# Patient Record
Sex: Female | Born: 1995 | Hispanic: No | Marital: Single | Smoking: Never smoker
Health system: Southern US, Community
[De-identification: ages and names within clinical notes are randomized; demographics above are authoritative.]

## PROBLEM LIST (undated history)

## (undated) ENCOUNTER — Ambulatory Visit: Discharge status: Absent without leave | Payer: Federal, State, Local not specified - PPO

## (undated) ENCOUNTER — Emergency Department (HOSPITAL_COMMUNITY): Discharge status: Against Medical Advice | Payer: Federal, State, Local not specified - PPO

## (undated) DIAGNOSIS — D509 Iron deficiency anemia, unspecified: Secondary | ICD-10-CM

## (undated) DIAGNOSIS — F339 Major depressive disorder, recurrent, unspecified: Secondary | ICD-10-CM

## (undated) HISTORY — DX: Iron deficiency anemia, unspecified: D50.9

## (undated) HISTORY — DX: Major depressive disorder, recurrent, unspecified: F33.9

---
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
## 2015-11-26 HISTORY — PX: SIGMOIDOSCOPY: SUR1295

## 2016-01-03 ENCOUNTER — Ambulatory Visit: Payer: Federal, State, Local not specified - PPO | Admitting: Physical Therapy

## 2016-01-03 ENCOUNTER — Encounter: Payer: Self-pay | Admitting: Physical Therapy

## 2016-01-03 ENCOUNTER — Ambulatory Visit: Payer: Federal, State, Local not specified - PPO | Attending: Obstetrics & Gynecology | Admitting: Physical Therapy

## 2016-01-03 DIAGNOSIS — R279 Unspecified lack of coordination: Secondary | ICD-10-CM | POA: Diagnosis present

## 2016-01-03 DIAGNOSIS — M62838 Other muscle spasm: Secondary | ICD-10-CM

## 2016-01-03 NOTE — Therapy (Signed)
[facility] [address] [place], [place], [address] Phone: [phone]   Fax:  [phone]  Physical Therapy Evaluation  Patient Details  Name: Amy Herring MRN: [id number] Date of Birth: Jul 24, 1995 Referring Provider: Dr. Forbes Cellar  Encounter Date: 01/03/2016      PT End of Session - 01/03/16 1427    Visit Number 1   Date for PT Re-Evaluation 05/02/16   PT Start Time 1022  stuck in a traffic jam   PT Stop Time 1100   PT Time Calculation (min) 38 min   Activity Tolerance Patient tolerated treatment well   Behavior During Therapy Anxious      History reviewed. No pertinent past medical history.  Past Surgical History:  Procedure Laterality Date  . SIGMOIDOSCOPY  11/26/2015    There were no vitals filed for this visit.       Subjective Assessment - 01/03/16 1032    Subjective Patient reports her vagina has unvoluntary spasms.  Patient unable to have intercourse due to feeling like ther is a wall and everything is very tight. Has been happening for 3 years. Vaginal area is dry. Unable to do foreplay in vaginal area due to pain. Unable to wear a tampon. Not able to have a vaginal exam.    Patient Stated Goals be able to have intercourse, use a tampon, reduce painic attacks with sex   Currently in Pain? Yes   Pain Score 10-Worst pain ever   Pain Location Vagina   Pain Orientation Mid   Pain Descriptors / Indicators Sharp   Pain Type Chronic pain   Pain Onset Other (comment)   Pain Frequency Intermittent   Aggravating Factors  intercourse; tampone, vaginal exam   Pain Relieving Factors no touching in the vaginal    Multiple Pain Sites No            [facility] PT Assessment - 01/03/16 0001      Assessment   Medical Diagnosis Vaginismus   Referring Provider Dr. Reita Cliche Louie   Onset Date/Surgical Date 02/04/12   Prior Therapy None     Precautions   Precautions None     Restrictions   Weight Bearing Restrictions No     Balance Screen   Has the patient fallen in the past 6 months No   Has the patient had a decrease in activity level because of a fear of falling?  No   Is the patient reluctant to leave their home because of a fear of falling?  No     Home Tourist information centre manager residence     Prior Function   Level of Independence Independent   Vocation Student;Part time employment   Vocation Requirements standing; goes for nursing   Leisure Volleyball team-practic 2 hours 5 days/week; cheerleading     Cognition   Overall Cognitive Status Within Functional Limits for tasks assessed     Observation/Other Assessments   Focus on Therapeutic Outcomes (FOTO)  PFDI survey is 17% limitation     Posture/Postural Control   Posture/Postural Control No significant limitations     ROM / Strength   AROM / PROM / Strength AROM;Strength;PROM     AROM   Lumbar Extension decreased by 25%   Lumbar - Left Side Bend decreased by 25%     PROM   PROM Assessment Site Hip   Right Hip External Rotation  60   Left Hip Flexion 110   Left Hip External  Rotation  60     Strength   Overall Strength Comments bil. hip strength is 5/5     Right Hip   Right Hip Flexion 105     Palpation   Palpation comment tightness of the abdominal muscles, bil. hip adductors, bil. hamstring, bil. quads     Ambulation/Gait   Ambulation/Gait No                 Pelvic Floor Special Questions - 01/03/16 0001    Currently Sexually Active No   Marinoff Scale pain prevents any attempts at intercourse   Urinary Leakage No   Urinary urgency Yes   Urinary frequency hold urine for hours   Fecal incontinence No  constipation   Caffeine beverages 1-2 bottles of caffiene   External Perineal Exam Did not exam the perineal area due to patient being very anxious and not comfortable with the therapist yet; Will examine in the furture when the patient is more  comfortable with therapist   Exam Type Deferred                  PT Education - 01/03/16 1426    Education provided Yes   Education Details diaphgramatic breathing; you tube video for pelvic floor meditation and yoga, [email or web address], dilators   Person(s) Educated Patient   Methods Explanation;Demonstration;Handout   Comprehension Verbalized understanding;Returned demonstration          PT Short Term Goals - 01/03/16 1435      PT SHORT TERM GOAL #1   Title independent with flexibility exercises   Time 4   Period Weeks   Status New     PT SHORT TERM GOAL #2   Title understand how to perform diaphragmatic breathing to relax the pelvic floor with meditation   Time 4   Period Weeks   Status New     PT SHORT TERM GOAL #3   Title ability to demonstrate abdominal massage to promote perstalic movement of intestines to reduce constipation   Time 4   Period Weeks   Status New     PT SHORT TERM GOAL #4   Title her fiance able to lightly touch her inner thighs as patient peforms relaxation techniques.    Time 4   Period Weeks   Status New           PT Long Term Goals - 01/03/16 1438      PT LONG TERM GOAL #1   Title indepenedent with HEP and how to progress herself   Time 4   Period Months   Status New     PT LONG TERM GOAL #2   Title constipation decreased >/= 50% due to ability to relax her pelvic floor below 3-4 uv   Time 4   Period Months   Status New     PT LONG TERM GOAL #3   Title fiance able to penetrate the vaginal canal with no more than 3/10 pain due to relaxation of the vaginal tissue   Time 4   Period Weeks   Status New     PT LONG TERM GOAL #4   Title ability to relax her pelvic floor so she is able to use the 4th size dilator to increase vaginal introitus for vaginal exam   Time 4   Period Months   Status New               Plan - 01/03/16 1428    Clinical Impression Statement Patient  is a  20 year old female with diagnosis  of Vaginismus for the past 3 years.  Patient pain level is a 10/10 if she attempts intercourse with her fiance. Patient is unable to use a tampon or have a vaginal exam due to the restriction of the vaginal canal.  Therapist was not able to physically examine the vaginal area due to patient increase in axiety.  Therapist explained everything that needed to be done with the evaluation and only is done if she agrees. Patient has tight bilateral hip flexion and external rotation.  Abdominal wall and bilateral thighs has increased fascial tightness. Marinoff score is 3/3. Patient reports issues with constipation.  Patient is low complexity du eto stavle condition and no comorbidities that will impact care provided. Patient will benefit from skilled therapy to reduce vaginal pain to have penile penetration and vaginal exam.    Rehab Potential Good   Clinical Impairments Affecting Rehab Potential very anxious about the vaginal area being touched   PT Frequency 1x / week   PT Duration Other (comment)  4 months   PT Treatment/Interventions Biofeedback;Electrical Stimulation;Ultrasound;Moist Heat;Therapeutic activities;Therapeutic exercise;Neuromuscular re-education;Patient/family education;Manual techniques;Dry needling   PT Next Visit Plan abdominal soft tissue work; Brewing technologistrelaxation techniques; toileting technique;    PT Home Exercise Plan relaxation techniques; stretches   Recommended Other Services purchasing a dilator   Consulted and Agree with Plan of Care Patient      Patient will benefit from skilled therapeutic intervention in order to improve the following deficits and impairments:  Increased fascial restricitons, Decreased endurance, Increased muscle spasms, Decreased activity tolerance, Pain, Hypomobility, Decreased mobility  Visit Diagnosis: Other muscle spasm - Plan: PT plan of care cert/re-cert  Unspecified lack of coordination - Plan: PT plan of care cert/re-cert     Problem List There are  no active problems to display for this patient.   Eulis FosterCheryl Journei Thomassen, PT 01/03/16 2:56 PM   [facility] [address] [place], [place], [address] Phone: [phone]   Fax:  [phone]  Name: Dionisio PaschalDevinny Henrickson MRN: [id number] Date of Birth: 12/23/1995

## 2016-01-03 NOTE — Patient Instructions (Addendum)
Hook-Lying    Lie with hips and knees bent. Allow body's muscles to relax. Place hands on belly. Inhale slowly and deeply for 3 seconds, so hands move up. Then take _3__ seconds to exhale. Repeat _10__ times. Do _2__ times a day.   Copyright  VHI. All rights reserved.    Sitting    Sit comfortably. Allow body's muscles to relax. Place hands on belly. Inhale slowly and deeply for _3__ seconds, so hands move out. Then take _3__ seconds to exhale. Repeat _10__ times. Do __2_ times a day.  Copyright  VHI. All rights reserved.   You tube video Pelvic floor meditation by [employer]  You tube video following Shelly Prosko  [email or web address]- can purchase dilators and book on [facility]  [facility] [address], [address] [place], [place] [address] Phone # [phone] Fax [phone]

## 2016-01-09 ENCOUNTER — Ambulatory Visit: Payer: Federal, State, Local not specified - PPO | Admitting: Physical Therapy

## 2016-01-10 ENCOUNTER — Encounter: Payer: Self-pay | Admitting: Physical Therapy

## 2016-01-10 ENCOUNTER — Ambulatory Visit: Payer: Federal, State, Local not specified - PPO | Attending: Obstetrics & Gynecology | Admitting: Physical Therapy

## 2016-01-10 DIAGNOSIS — R279 Unspecified lack of coordination: Secondary | ICD-10-CM | POA: Diagnosis present

## 2016-01-10 DIAGNOSIS — M62838 Other muscle spasm: Secondary | ICD-10-CM | POA: Insufficient documentation

## 2016-01-10 NOTE — Therapy (Addendum)
[facility] [address] [place], [place], [address] Phone: [phone]   Fax:  [phone]  Physical Therapy Treatment  Patient Details  Name: Amy Herring MRN: [id number] Date of Birth: [birth year] Referring Provider: Dr. Lenice Pressman  Encounter Date: 01/10/2016      PT End of Session - 01/10/16 1150    Visit Number 2   Date for PT Re-Evaluation 05/02/16   PT Start Time 3267   PT Stop Time 1225   PT Time Calculation (min) 40 min   Activity Tolerance Patient tolerated treatment well   Behavior During Therapy Anxious      History reviewed. No pertinent past medical history.  Past Surgical History:  Procedure Laterality Date  . SIGMOIDOSCOPY  11/26/2015    There were no vitals filed for this visit.      Subjective Assessment - 01/10/16 1148    Subjective Able to do the relaxation video and breath into my stomach.    Patient Stated Goals be able to have intercourse, use a tampon, reduce painic attacks with sex   Currently in Pain? Yes   Pain Score 10-Worst pain ever   Pain Location Vagina   Pain Orientation Mid   Pain Descriptors / Indicators Sharp   Pain Type Chronic pain   Pain Onset Other (comment)   Pain Frequency Intermittent   Aggravating Factors  intercourse; tampons; vaginal exam   Pain Relieving Factors no touching in the vaginal    Multiple Pain Sites No                         [facility] Adult PT Treatment/Exercise - 01/10/16 0001      Manual Therapy   Manual Therapy Myofascial release;Soft tissue mobilization   Manual therapy comments use prickly ball roller on quads and outter thigh to improve fascial restrictions and be comfortablewith someone touching her thighs   Soft tissue mobilization abdominal massage to promote peristalic motion of the large intestines to promote bowel movement and relaxation of the pelvic floor   Myofascial Release urogenital diaphgram  release with one hand on abdomen and back                PT Education - 01/10/16 1227    Education provided Yes   Education Details abdominal massage; how to use a muscle roller to massage bilateral thighs   Person(s) Educated Patient   Methods Explanation;Demonstration;Verbal cues;Handout   Comprehension Returned demonstration;Verbalized understanding          PT Short Term Goals - 01/10/16 1231      PT SHORT TERM GOAL #1   Title independent with flexibility exercises   Time 4   Period Weeks   Status On-going     PT SHORT TERM GOAL #2   Title understand how to perform diaphragmatic breathing to relax the pelvic floor with meditation   Time 4   Period Weeks   Status Achieved     PT SHORT TERM GOAL #3   Title ability to demonstrate abdominal massage to promote perstalic movement of intestines to reduce constipation   Time 4   Period Weeks   Status On-going     PT SHORT TERM GOAL #4   Title her fiance able to lightly touch her inner thighs as patient peforms relaxation techniques.    Time 4   Period Weeks   Status On-going           PT  Long Term Goals - 01/03/16 1438      PT LONG TERM GOAL #1   Title indepenedent with HEP and how to progress herself   Time 4   Period Months   Status New     PT LONG TERM GOAL #2   Title constipation decreased >/= 50% due to ability to relax her pelvic floor below 3-4 uv   Time 4   Period Months   Status New     PT LONG TERM GOAL #3   Title fiance able to penetrate the vaginal canal with no more than 3/10 pain due to relaxation of the vaginal tissue   Time 4   Period Weeks   Status New     PT LONG TERM GOAL #4   Title ability to relax her pelvic floor so she is able to use the 4th size dilator to increase vaginal introitus for vaginal exam   Time 4   Period Months   Status New               Plan - 01/10/16 1227    Clinical Impression Statement Patient has been doing the pelvic meditation.  Patient  is not able to lift greater than 10# due to hemmorroid banding healing.  Patient is not comfortable with her fiance touching her thighs.  Patient will be having surgery on 02/18/2016 to release the hymen and have a pap smear.  Patient is not comfortble with touch so techniques are done with clothes on. Patient will benefit from skilled therapy to reduce pain, improve constipation and reduce vaginal pain to have penile penetration and vaginal exam.    Rehab Potential Good   Clinical Impairments Affecting Rehab Potential very anxious about the vaginal area being touched; 02/18/2016 having surgery to release hymen and pap smear   PT Frequency 1x / week   PT Duration Other (comment)  4 months   PT Treatment/Interventions Biofeedback;Electrical Stimulation;Ultrasound;Moist Heat;Therapeutic activities;Therapeutic exercise;Neuromuscular re-education;Patient/family education;Manual techniques;Dry needling   PT Next Visit Plan abdominal soft tissue work; ; toileting technique; prone frog leg position, legs on wall   PT Home Exercise Plan relaxation techniques; stretches   Consulted and Agree with Plan of Care Patient      Patient will benefit from skilled therapeutic intervention in order to improve the following deficits and impairments:  Increased fascial restricitons, Decreased endurance, Increased muscle spasms, Decreased activity tolerance, Pain, Hypomobility, Decreased mobility  Visit Diagnosis: Other muscle spasm  Unspecified lack of coordination     Problem List There are no active problems to display for this patient.   Earlie Counts, PT 01/10/16 12:32 PM    [facility] [address], [place], [place], [address] Phone: [phone]   Fax:  [phone]  Name: Amy Herring MRN: [id number] Date of Birth: 08-10-1995  PHYSICAL THERAPY DISCHARGE SUMMARY  Visits from Start of Care: 2  Current functional level related to  goals / functional outcomes: Patient has not returned since her last visit on 01/10/2016. See above.    Remaining deficits: See above.   Education / Equipment: HEP  Plan:                                                    Patient goals were not met. Patient is being discharged due to not returning since the  last visit. Thank you for the referral. Earlie Counts, PT 03/06/16 3:27 PM    ?????

## 2016-01-10 NOTE — Patient Instructions (Signed)
About Abdominal Massage  Abdominal massage, also called external colon massage, is a self-treatment circular massage technique that can reduce and eliminate gas and ease constipation. The colon naturally contracts in waves in a clockwise direction starting from inside the right hip, moving up toward the ribs, across the belly, and down inside the left hip.  When you perform circular abdominal massage, you help stimulate your colon's normal wave pattern of movement called peristalsis.  It is most beneficial when done after eating.  Positioning You can practice abdominal massage with oil while lying down, or in the shower with soap.  Some people find that it is just as effective to do the massage through clothing while sitting or standing.  How to Massage Start by placing your finger tips or knuckles on your right side, just inside your hip bone.  . Make small circular movements while you move upward toward your rib cage.   . Once you reach the bottom right side of your rib cage, take your circular movements across to the left side of the bottom of your rib cage.  . Next, move downward until you reach the inside of your left hip bone.  This is the path your feces travel in your colon. . Continue to perform your abdominal massage in this pattern for 10 minutes each day.     You can apply as much pressure as is comfortable in your massage.  Start gently and build pressure as you continue to practice.  Notice any areas of pain as you massage; areas of slight pain may be relieved as you massage, but if you ha ve areas of significant or intense pain, consult with your healthcare provider.  Other Considerations . General physical activity including bending and stretching can have a beneficial massage-like effect on the colon.  Deep breathing can also stimulate the colon because breathing deeply activates the same nervous system that supplies the colon.   . Abdominal massage should always be used in  combination with a bowel-conscious diet that is high in the proper type of fiber for you, fluids (primarily water), and a regular exercise program.  Start with roller 10 min per day on inner thigh, outer thigh, quads, and hamstring. When feel comfortable have Fiance try over your clothes.  [facility] [address], [address] [place], [place] [address] Phone # [phone] Fax [phone]

## 2016-01-17 ENCOUNTER — Encounter: Payer: Federal, State, Local not specified - PPO | Admitting: Physical Therapy

## 2019-09-01 ENCOUNTER — Emergency Department (HOSPITAL_COMMUNITY)
Admission: EM | Admit: 2019-09-01 | Discharge: 2019-09-01 | Disposition: A | Discharge status: Home / Self Care | Payer: BC Managed Care – PPO | Attending: Emergency Medicine | Admitting: Emergency Medicine

## 2019-09-01 ENCOUNTER — Emergency Department (HOSPITAL_COMMUNITY): Payer: BC Managed Care – PPO

## 2019-09-01 ENCOUNTER — Encounter: Payer: Self-pay | Admitting: Emergency Medicine

## 2019-09-01 ENCOUNTER — Other Ambulatory Visit: Payer: Self-pay

## 2019-09-01 ENCOUNTER — Ambulatory Visit
Admission: EM | Admit: 2019-09-01 | Discharge: 2019-09-01 | Disposition: A | Discharge status: Home / Self Care | Payer: BC Managed Care – PPO | Source: Home / Self Care

## 2019-09-01 DIAGNOSIS — N939 Abnormal uterine and vaginal bleeding, unspecified: Secondary | ICD-10-CM | POA: Diagnosis not present

## 2019-09-01 DIAGNOSIS — R102 Pelvic and perineal pain: Secondary | ICD-10-CM | POA: Diagnosis not present

## 2019-09-01 LAB — BASIC METABOLIC PANEL
Anion gap: 11 (ref 5–15)
BUN: 10 mg/dL (ref 6–20)
CO2: 21 mmol/L — ABNORMAL LOW (ref 22–32)
Calcium: 9.3 mg/dL (ref 8.9–10.3)
Chloride: 105 mmol/L (ref 98–111)
Creatinine, Ser: 0.91 mg/dL (ref 0.44–1.00)
GFR calc Af Amer: >60 mL/min (ref >60)
GFR calc non Af Amer: >60 mL/min (ref >60)
Glucose, Bld: 134 mg/dL — ABNORMAL HIGH (ref 70–99)
Potassium: 3.8 mmol/L (ref 3.5–5.1)
Sodium: 137 mmol/L (ref 135–145)

## 2019-09-01 LAB — WET PREP, GENITAL
Sperm: NONE SEEN
Trich, Wet Prep: NONE SEEN
Yeast Wet Prep HPF POC: NONE SEEN

## 2019-09-01 LAB — CBC
HCT: 35.7 % — ABNORMAL LOW (ref 36.0–46.0)
Hemoglobin: 10.6 g/dL — ABNORMAL LOW (ref 12.0–15.0)
MCH: 25.5 pg — ABNORMAL LOW (ref 26.0–34.0)
MCHC: 29.7 g/dL — ABNORMAL LOW (ref 30.0–36.0)
MCV: 85.8 fL (ref 80.0–100.0)
Platelets: 327 10*3/uL (ref 150–400)
RBC: 4.16 MIL/uL (ref 3.87–5.11)
RDW: 12.8 % (ref 11.5–15.5)
WBC: 6.9 10*3/uL (ref 4.0–10.5)
nRBC: 0 % (ref 0.0–0.2)

## 2019-09-01 LAB — I-STAT BETA HCG BLOOD, ED (MC, WL, AP ONLY): I-stat hCG, quantitative: <5 m[IU]/mL (ref <5)

## 2019-09-01 MED ORDER — KETOROLAC TROMETHAMINE 15 MG/ML IJ SOLN: 15.0000 mg | Freq: Once | INTRAMUSCULAR | Status: DC

## 2019-09-01 MED ORDER — KETOROLAC TROMETHAMINE 60 MG/2ML IM SOLN
30.0000 mg | Freq: Once | INTRAMUSCULAR | Status: AC
  Administered 2019-09-01: 30 mg via INTRAMUSCULAR
  Filled 2019-09-01: qty 2

## 2019-09-01 MED ORDER — MEGESTROL ACETATE 40 MG PO TABS: ORAL_TABLET | ORAL | 0 refills | Status: DC

## 2019-09-01 MED ORDER — LORAZEPAM 2 MG/ML IJ SOLN: 0.5000 mg | Freq: Once | INTRAMUSCULAR | Status: DC

## 2019-09-01 MED ORDER — MEGESTROL ACETATE 40 MG PO TABS: 40.0000 mg | ORAL_TABLET | Freq: Two times a day (BID) | ORAL | 0 refills | Status: DC

## 2019-09-01 MED ORDER — ONDANSETRON 4 MG PO TBDP: 4.0000 mg | ORAL_TABLET | Freq: Three times a day (TID) | ORAL | 0 refills | Status: DC | PRN

## 2019-09-01 MED ORDER — NAPROXEN 375 MG PO TABS: 375.0000 mg | ORAL_TABLET | Freq: Two times a day (BID) | ORAL | 0 refills | Status: DC

## 2019-09-01 MED ORDER — LORAZEPAM 2 MG/ML IJ SOLN
1.0000 mg | Freq: Once | INTRAMUSCULAR | Status: AC
  Administered 2019-09-01: 1 mg via INTRAMUSCULAR
  Filled 2019-09-01: qty 1

## 2019-09-01 NOTE — Discharge Instructions (Addendum)
Take your the medication I have prescribed.  Follow up ASAP with your ob/gyn (preferably before your prescription runs out). Get help right away if: You pass out. Your bleeding soaks through a pad every hour. You have  severe abdominal pain. You have a fever. You become sweaty or weak.

## 2019-09-01 NOTE — ED Triage Notes (Signed)
Pt sent here from urgent care for eval of vaginal bleeding and pelvic pain x 9 days. Pt also concerned because she has been craving chalk since April.

## 2019-09-01 NOTE — ED Notes (Signed)
Pt d/c home per MD order. Discharge summary reviewed with pt, pt verbalizes understanding. Ambulatory off unit with mother. No s/s of acute distress noted.

## 2019-09-01 NOTE — ED Triage Notes (Signed)
Pt presents to [facility] for assessment of LLQ abdominal pain starting in April.  Patient states she had no had a period for 93 days, and started having heavier cycles.  Patient states she was put on an Estrogen patch which seems to have made things worse.  States cycle x 9 days, with abdominal pain and blood clots.

## 2019-09-01 NOTE — ED Notes (Signed)
Pt transported to [facility]

## 2019-09-01 NOTE — ED Notes (Signed)
Pt tolerated Ortho VS well. Pt denied feeling lightheaded, dizzy, unbalanced or changes to vision. PA notified of these findings.

## 2019-09-01 NOTE — ED Notes (Signed)
Patient able to ambulate independently  

## 2019-09-01 NOTE — ED Provider Notes (Signed)
[name]    CSN: [id number] Arrival date & time: 09/01/19  0804      History   Chief Complaint Chief Complaint  Patient presents with  . Abdominal Pain    HPI Amy Herring is a 24 y.o. female.   24 year old female comes in for AUB. She was seen by GYN one month ago for abdominal pain, amenorrhea, dyspareunia.  She was put on estrogen patch, started 14 days ago, and started having vaginal bleeding 9 days ago.  Since then, has had increased and vaginal bleeding, going through 4 pads/hr.  Had nausea without vomiting, this resolved after she removed her patch. Not currently sexually active.      History reviewed. No pertinent past medical history.  There are no problems to display for this patient.   Past Surgical History:  Procedure Laterality Date  . SIGMOIDOSCOPY  11/26/2015    OB History   No obstetric history on file.      Home Medications    Prior to Admission medications   Medication Sig Start Date End Date Taking? Authorizing Provider  ibuprofen (ADVIL,MOTRIN) 200 MG tablet Take 400 mg by mouth every 8 (eight) hours as needed for pain.    [provider]  lidocaine (XYLOCAINE) 5 % ointment Apply 1 application topically as needed.    [provider]  megestrol (MEGACE) 40 MG tablet Take 1 tablet (40 mg total) by mouth 2 (two) times daily. 09/01/19   [name], PA-C  metaxalone (SKELAXIN) 800 MG tablet Take 0.5 tablets (400 mg total) by mouth 3 (three) times daily. Patient not taking: Reported on 01/03/2016 08/02/12   Earley Favor, NP  Multiple Vitamin (MULTIVITAMIN WITH MINERALS) TABS Take 1 tablet by mouth daily.    [provider]  ondansetron (ZOFRAN ODT) 4 MG disintegrating tablet Take 1 tablet (4 mg total) by mouth every 8 (eight) hours as needed for nausea or vomiting. 09/01/19   [name], PA-C    Family History Family History  Problem Relation Age of Onset  . Healthy Mother   . Healthy Father      Social History Social History   Tobacco Use  . Smoking status: Never Smoker  . Smokeless tobacco: Never Used  Substance Use Topics  . Alcohol use: No  . Drug use: No     Allergies   Patient has no known allergies.   Review of Systems Review of Systems  Reason unable to perform ROS: See HPI as above.     Physical Exam Triage Vital Signs ED Triage Vitals  Enc Vitals Group     BP 09/01/19 0816 (!) 131/75     Pulse Rate 09/01/19 0816 95     Resp 09/01/19 0816 16     Temp 09/01/19 0816 98.7 F (37.1 C)     Temp Source 09/01/19 0816 Oral     SpO2 09/01/19 0816 97 %     Weight --      Height --      Head Circumference --      Peak Flow --      Pain Score 09/01/19 0818 10     Pain Loc --      Pain Edu? --      Excl. in GC? --    No data found.  Updated Vital Signs BP (!) 131/75 (BP Location: Left Arm)   Pulse 95   Temp 98.7 F (37.1 C) (Oral)   Resp 16  LMP 08/23/2019   SpO2 97%   Visual Acuity Right Eye Distance:   Left Eye Distance:   Bilateral Distance:    Right Eye Near:   Left Eye Near:    Bilateral Near:     Physical Exam Constitutional:      General: She is not in acute distress.    Appearance: Normal appearance. She is well-developed. She is not toxic-appearing or diaphoretic.  HENT:     Head: Normocephalic and atraumatic.  Eyes:     Conjunctiva/sclera: Conjunctivae normal.     Pupils: Pupils are equal, round, and reactive to light.  Cardiovascular:     Rate and Rhythm: Normal rate and regular rhythm.  Pulmonary:     Effort: Pulmonary effort is normal. No respiratory distress.     Comments: LCTAB Abdominal:     Comments: Soft, +BS. Patient pulled my arm away when attempting palpation  Musculoskeletal:     Cervical back: Normal range of motion and neck supple.  Skin:    General: Skin is warm and dry.  Neurological:     Mental Status: She is alert and oriented to person, place, and time.      UC Treatments / Results   Labs (all labs ordered are listed, but only abnormal results are displayed) Labs Reviewed - No data to display  EKG   Radiology No results found.  Procedures Procedures (including critical care time)  Medications Ordered in UC Medications - No data to display  Initial Impression / Assessment and Plan / UC Course  I have reviewed the triage vital signs and the nursing notes.  Pertinent labs & imaging results that were available during my care of the patient were reviewed by me and considered in my medical decision making (see chart for details).    Patient would not allow full abdominal exam, grabbing my hand away. Discussed if severe pain, should be evaluated at the ED. Patient states this is her baseline since April, "always like this when I have a cycle" and "I just want to stop the bleeding". Patient noted to be sitting comfortably on the exam table and able to ambulate on own without difficulty. Have not been sexually active, will start Megace and have patient follow up with GYN for further evaluation  Final Clinical Impressions(s) / UC Diagnoses   Final diagnoses:  Abnormal uterine bleeding (AUB)    ED Prescriptions    Medication Sig Dispense Auth. Provider   megestrol (MEGACE) 40 MG tablet Take 1 tablet (40 mg total) by mouth 2 (two) times daily. 20 tablet [name] V, PA-C   ondansetron (ZOFRAN ODT) 4 MG disintegrating tablet Take 1 tablet (4 mg total) by mouth every 8 (eight) hours as needed for nausea or vomiting. 15 tablet [name], PA-C     PDMP not reviewed this encounter.   [name], PA-C 09/01/19 [phone]

## 2019-09-01 NOTE — Discharge Instructions (Addendum)
As discussed, concerns for your abdominal pain. If this is different for you, please go to the ED for further evaluation. Otherwise, start megace for bleeding and follow up with GYN for further evaluation. If worsening, go to the ED for further evaluation.

## 2019-09-01 NOTE — ED Provider Notes (Signed)
[facility] EMERGENCY DEPARTMENT Provider Note   CSN: [id number] Arrival date & time: 09/01/19  8372     History Chief Complaint  Patient presents with  . Vaginal Bleeding    Amy Herring is a 24 y.o. female who was sent in for evaluation of pelvic pain and vaginal bleeding.  Patient is followed at [facility] for her OB/GYN complaints due to a history of vaginismus and inability to obtain a pelvic examination without full sedation.  The patient states that she has also not had previous intercourse.  She has been having significant bleeding and review of EMR shows that she has a history of abnormal uterine bleeding and elevated prolactin levels.  She was given an estrogen patch to use states that it made her feel very sick and she began having heavy bleeding every day for the past 2 weeks.  She is notably anemic today at 10.6 and states that she has never been anemic in the past.  She has been soaking up to 4 pads an hour.  She denies feeling short of breath or having dizziness with standing.  She states that she is willing to try and undergo a pelvic examination today.  HPI     No past medical history on file.  There are no problems to display for this patient.   Past Surgical History:  Procedure Laterality Date  . SIGMOIDOSCOPY  11/26/2015     OB History   No obstetric history on file.     Family History  Problem Relation Age of Onset  . Healthy Mother   . Healthy Father     Social History   Tobacco Use  . Smoking status: Never Smoker  . Smokeless tobacco: Never Used  Substance Use Topics  . Alcohol use: No  . Drug use: No    Home Medications Prior to Admission medications   Medication Sig Start Date End Date Taking? Authorizing Provider  ibuprofen (ADVIL,MOTRIN) 200 MG tablet Take 400 mg by mouth every 8 (eight) hours as needed for pain.    [provider]  lidocaine (XYLOCAINE) 5 % ointment Apply 1 application topically as needed.     [provider]  megestrol (MEGACE) 40 MG tablet Take 1 tablet (40 mg total) by mouth 2 (two) times daily. 09/01/19   [name], PA-C  metaxalone (SKELAXIN) 800 MG tablet Take 0.5 tablets (400 mg total) by mouth 3 (three) times daily. Patient not taking: Reported on 01/03/2016 08/02/12   Earley Favor, NP  Multiple Vitamin (MULTIVITAMIN WITH MINERALS) TABS Take 1 tablet by mouth daily.    [provider]  ondansetron (ZOFRAN ODT) 4 MG disintegrating tablet Take 1 tablet (4 mg total) by mouth every 8 (eight) hours as needed for nausea or vomiting. 09/01/19   [name], PA-C    Allergies    Patient has no known allergies.  Review of Systems   Review of Systems Ten systems reviewed and are negative for acute change, except as noted in the HPI.   Physical Exam Updated Vital Signs BP (!) 130/59   Pulse 101   Temp 98.6 F (37 C) (Oral)   Resp 16   Ht 5' 7.5" (1.715 m)   Wt (!) 99.8 kg   LMP 08/23/2019   SpO2 97%   BMI 33.95 kg/m   Physical Exam Vitals and nursing note reviewed.  Constitutional:      General: She is not in acute distress.    Appearance: She is  well-developed. She is not diaphoretic.  HENT:     Head: Normocephalic and atraumatic.  Eyes:     General: No scleral icterus.    Conjunctiva/sclera: Conjunctivae normal.  Cardiovascular:     Rate and Rhythm: Normal rate and regular rhythm.     Heart sounds: Normal heart sounds. No murmur heard.  No friction rub. No gallop.   Pulmonary:     Effort: Pulmonary effort is normal. No respiratory distress.     Breath sounds: Normal breath sounds.  Abdominal:     General: Bowel sounds are normal. There is no distension.     Palpations: Abdomen is soft. There is no mass.     Tenderness: There is no abdominal tenderness. There is no guarding.  Musculoskeletal:     Cervical back: Normal range of motion.  Skin:    General: Skin is warm and dry.  Neurological:     Mental Status: She is alert and oriented to  person, place, and time.  Psychiatric:        Behavior: Behavior normal.     ED Results / Procedures / Treatments   Labs (all labs ordered are listed, but only abnormal results are displayed) Labs Reviewed  CBC - Abnormal; Notable for the following components:      Result Value   Hemoglobin 10.6 (*)    HCT 35.7 (*)    MCH 25.5 (*)    MCHC 29.7 (*)    All other components within normal limits  BASIC METABOLIC PANEL - Abnormal; Notable for the following components:   CO2 21 (*)    Glucose, Bld 134 (*)    All other components within normal limits  I-STAT BETA HCG BLOOD, ED ([facility], [facility], [facility] ONLY)    EKG None  Radiology No results found.  Procedures Pelvic exam  Date/Time: 09/01/2019 3:07 PM Performed by: Arthor Captain, PA-C Authorized by: Arthor Captain, PA-C  Comments: Pelvic exam: VULVA: normal appearing vulva with no masses, tenderness or lesions, VAGINA: normal appearing vagina with normal color - bleeding noted from the vagina, no lesions, exam chaperoned, exam terminated early at patient request.      (including critical care time)  Medications Ordered in ED Medications  ketorolac (TORADOL) injection 30 mg (30 mg Intramuscular Given 09/01/19 1313)  LORazepam (ATIVAN) injection 1 mg (1 mg Intramuscular Given 09/01/19 1313)    ED Course  I have reviewed the triage vital signs and the nursing notes.  Pertinent labs & imaging results that were available during my care of the patient were reviewed by me and considered in my medical decision making (see chart for details).    MDM Rules/Calculators/A&P                         Patient here with abnormal uterine bleeding.  She does state that her hemoglobin is not usually low however she does not have orthostatic vital signs.  Initially came in hypertensive and tachycardic which I think is a reflection of her anxiety.  hCG is negative.  Wet prep showed a few clue clue cells however she is asymptomatic and therefore we  will avoid treatment.  I ordered and reviewed ultrasound of the pelvis which shows no abnormalities.  Patient will be discharged on Megace taper along with naproxen.  She may follow-up with her OB/GYN.  She appears otherwise appropriate for discharge at this time  Final Clinical Impression(s) / ED Diagnoses Final diagnoses:  None    Rx /  DC Orders ED Discharge Orders    None       Arthor Captain, PA-C 09/02/19 1429    Benjiman Core, MD 09/08/19 1451

## 2019-10-17 DIAGNOSIS — N939 Abnormal uterine and vaginal bleeding, unspecified: Secondary | ICD-10-CM | POA: Insufficient documentation

## 2019-12-05 DIAGNOSIS — D649 Anemia, unspecified: Secondary | ICD-10-CM | POA: Insufficient documentation

## 2020-04-06 DIAGNOSIS — D500 Iron deficiency anemia secondary to blood loss (chronic): Secondary | ICD-10-CM | POA: Insufficient documentation

## 2020-12-29 ENCOUNTER — Other Ambulatory Visit: Payer: Self-pay

## 2020-12-29 ENCOUNTER — Emergency Department (HOSPITAL_COMMUNITY)
Admission: EM | Admit: 2020-12-29 | Discharge: 2020-12-29 | Disposition: A | Discharge status: Home / Self Care | Payer: BC Managed Care – PPO | Attending: Emergency Medicine | Admitting: Emergency Medicine

## 2020-12-29 DIAGNOSIS — M549 Dorsalgia, unspecified: Secondary | ICD-10-CM | POA: Diagnosis not present

## 2020-12-29 DIAGNOSIS — Z20822 Contact with and (suspected) exposure to covid-19: Secondary | ICD-10-CM | POA: Diagnosis not present

## 2020-12-29 DIAGNOSIS — R599 Enlarged lymph nodes, unspecified: Secondary | ICD-10-CM | POA: Diagnosis not present

## 2020-12-29 DIAGNOSIS — J029 Acute pharyngitis, unspecified: Secondary | ICD-10-CM

## 2020-12-29 DIAGNOSIS — Z28310 Unvaccinated for covid-19: Secondary | ICD-10-CM | POA: Diagnosis not present

## 2020-12-29 LAB — RESP PANEL BY RT-PCR (FLU A&B, COVID) ARPGX2
Influenza A by PCR: NEGATIVE
Influenza B by PCR: NEGATIVE
SARS Coronavirus 2 by RT PCR: NEGATIVE

## 2020-12-29 LAB — GROUP A STREP BY PCR: Group A Strep by PCR: NOT DETECTED

## 2020-12-29 MED ORDER — DEXAMETHASONE SODIUM PHOSPHATE 10 MG/ML IJ SOLN
10.0000 mg | Freq: Once | INTRAMUSCULAR | Status: AC
  Administered 2020-12-29: 10 mg via INTRAMUSCULAR
  Filled 2020-12-29: qty 1

## 2020-12-29 MED ORDER — IBUPROFEN 100 MG/5ML PO SUSP
600.0000 mg | Freq: Once | ORAL | Status: AC
  Administered 2020-12-29: 600 mg via ORAL
  Filled 2020-12-29: qty 30

## 2020-12-29 NOTE — ED Triage Notes (Signed)
Patient c/o sore throat, swollen lymph nodes, spine pain 9/10 starting 24 hours ago

## 2020-12-29 NOTE — ED Provider Notes (Signed)
[facility]-EMERGENCY DEPT Provider Note   CSN: [id number] Arrival date & time: 12/29/20  1645     History Chief Complaint  Patient presents with   Sore Throat   Back Pain    Amy Herring is a 25 y.o. female who presents with concern for sore throat and "swollen lymph nodes" and pain on the sides of her neck for the last 24 hours.  She denies any headaches, photosensitivity, fevers, chills, phonophobia, blurry or double vision.  Denies any cough, congestion, shortness of breath, palpitations, or other GI infectious symptoms.  Denies difficulty swallowing her secretions but states she has significant pain when swallowing food or drink.  Denies any difficulty breathing.  She is not vaccinated for COVID or influenza.  I personally reviewed her medical records.  She does not carry medical diagnoses, she is not on any medications daily.  HPI     No past medical history on file.  There are no problems to display for this patient.   Past Surgical History:  Procedure Laterality Date   SIGMOIDOSCOPY  11/26/2015     OB History   No obstetric history on file.     Family History  Problem Relation Age of Onset   Healthy Mother    Healthy Father     Social History   Tobacco Use   Smoking status: Never   Smokeless tobacco: Never  Substance Use Topics   Alcohol use: No   Drug use: No    Home Medications Prior to Admission medications   Medication Sig Start Date End Date Taking? Authorizing Provider  Aspirin-Cinnamedrine-Caffeine (MIDOL MAXIMUM STRENGTH PO) Take 4 tablets by mouth as needed (cramps).     [provider]  ibuprofen (ADVIL,MOTRIN) 200 MG tablet Take 400 mg by mouth every 8 (eight) hours as needed for pain.    [provider]  megestrol (MEGACE) 40 MG tablet 3 tabs a day for 5 days(all at once) 2 tabs a day for 5 days(all at once) then 1 tablet a day 09/01/19   Arthor Captain, PA-C  metaxalone (SKELAXIN) 800 MG  tablet Take 0.5 tablets (400 mg total) by mouth 3 (three) times daily. Patient not taking: Reported on 01/03/2016 08/02/12   Earley Favor, NP  naproxen (NAPROSYN) 375 MG tablet Take 1 tablet (375 mg total) by mouth 2 (two) times daily. 09/01/19   Harris, Cammy Copa, PA-C  ondansetron (ZOFRAN ODT) 4 MG disintegrating tablet Take 1 tablet (4 mg total) by mouth every 8 (eight) hours as needed for nausea or vomiting. 09/01/19   [name], PA-C  [facility] 150-35 MCG/24HR transdermal patch Place 1 patch onto the skin once a week. 08/10/19   [provider]    Allergies    Patient has no known allergies.  Review of Systems   Review of Systems  Constitutional: Negative.   HENT:  Positive for sore throat. Negative for congestion, sinus pressure, sneezing, trouble swallowing and voice change.   Eyes: Negative.   Respiratory: Negative.    Cardiovascular: Negative.   Gastrointestinal: Negative.   Genitourinary: Negative.   Allergic/Immunologic: Negative for immunocompromised state.  Neurological: Negative.    Physical Exam Updated Vital Signs BP 134/83 (BP Location: Right Arm)   Pulse 99   Temp 98.2 F (36.8 C) (Oral)   Resp 18   Ht 5\' 7"  (1.702 m)   Wt 81.6 kg   LMP 02/13/2020 (Approximate)   SpO2 97%   BMI 28.19 kg/m   Physical Exam Vitals and  nursing note reviewed.  Constitutional:      Appearance: She is not ill-appearing or toxic-appearing.  HENT:     Head: Normocephalic and atraumatic.     Right Ear: Tympanic membrane normal.     Left Ear: Tympanic membrane normal.     Nose: Nose normal.     Mouth/Throat:     Mouth: Mucous membranes are moist.     Tongue: No lesions.     Palate: No mass and lesions.     Pharynx: Uvula midline. Oropharyngeal exudate and posterior oropharyngeal erythema present. No uvula swelling.     Tonsils: Tonsillar exudate present. No tonsillar abscesses. 2+ on the right. 2+ on the left.     Comments: Posterior pharyngeal erythema with tonsillar edema  and exudate with patent airway.  Uvula is midline without deviation or edema.  No sublingual or submental tenderness to palpation.  Patient tolerating her own secretions well.  Eyes:     General: Lids are normal. Vision grossly intact.        Right eye: No discharge.        Left eye: No discharge.     Extraocular Movements: Extraocular movements intact.     Conjunctiva/sclera: Conjunctivae normal.     Pupils: Pupils are equal, round, and reactive to light.  Neck:     Trachea: Trachea and phonation normal.     Meningeal: Brudzinski's sign and Kernig's sign absent.  Cardiovascular:     Rate and Rhythm: Normal rate and regular rhythm.     Pulses: Normal pulses.     Heart sounds: Normal heart sounds. No murmur heard. Pulmonary:     Effort: Pulmonary effort is normal. No tachypnea, bradypnea, accessory muscle usage, prolonged expiration or respiratory distress.     Breath sounds: Normal breath sounds. No wheezing or rales.  Chest:     Chest wall: No mass, lacerations, deformity, swelling, tenderness, crepitus or edema.  Abdominal:     General: Bowel sounds are normal. There is no distension.     Palpations: Abdomen is soft.     Tenderness: There is no abdominal tenderness. There is no right CVA tenderness, left CVA tenderness, guarding or rebound.  Musculoskeletal:        General: No deformity.     Cervical back: Normal range of motion and neck supple. No edema, erythema, rigidity or crepitus. No pain with movement, spinous process tenderness or muscular tenderness. Normal range of motion.  Lymphadenopathy:     Cervical: Cervical adenopathy present.     Right cervical: Superficial cervical adenopathy and posterior cervical adenopathy present.     Left cervical: Superficial cervical adenopathy present.  Skin:    General: Skin is warm and dry.     Capillary Refill: Capillary refill takes less than 2 seconds.     Findings: No rash.  Neurological:     General: No focal deficit present.      Mental Status: She is alert. Mental status is at baseline.     GCS: GCS eye subscore is 4. GCS verbal subscore is 5. GCS motor subscore is 6.     Gait: Gait is intact. Gait normal.  Psychiatric:        Mood and Affect: Mood normal.    ED Results / Procedures / Treatments   Labs (all labs ordered are listed, but only abnormal results are displayed) Labs Reviewed  GROUP A STREP BY PCR  RESP PANEL BY RT-PCR (FLU A&B, COVID) [id number]    EKG None  Radiology  No results found.  Procedures Procedures   Medications Ordered in ED Medications  ibuprofen (ADVIL) 100 MG/5ML suspension 600 mg (600 mg Oral Given 12/29/20 1812)  dexamethasone (DECADRON) injection 10 mg (10 mg Intramuscular Given 12/29/20 2013)    ED Course  I have reviewed the triage vital signs and the nursing notes.  Pertinent labs & imaging results that were available during my care of the patient were reviewed by me and considered in my medical decision making (see chart for details).    MDM Rules/Calculators/A&P                         25 year old female presents with 24 hours of sore throat.    Differential diagnosis includes limited to strep enteritis, viral pharyngitis, peritonsillar abscess, retropharyngeal abscess, Ludwick's angina, lymphadenitis, dental infection.  Medicines and Intake.  Cardiopulmonary as well, abdominal pain is benign.  HEENT exam with tonsillar edema, exudate, posterior pharyngeal erythema without sublingual submental tenderness palpation to suggest Ludwig's angina.  Anterior and posterior cervical lymphadenopathy without anterior neck swelling, crepitus, or edema.  No tracheal deviation. Abdominal exam is benign.  Strep test negative, respiratory Panel negative.  Suspect other acute viral pharyngitis.  Ibuprofen and Decadron administered in the ED.  No further work-up warranted near this time as patient is nontoxic-appearing with reassuring work-up and physical exam.  Gurbani voiced  understanding of her medical evaluation and treatment plan.  Questions was answered to her expressed satisfaction.  Return precautions given.  Patient is well-appearing, stable, and appropriate for discharge at this time.  This chart was dictated using voice recognition software, Dragon. Despite the best efforts of this provider to proofread and correct errors, errors may still occur which can change documentation meaning.   Final Clinical Impression(s) / ED Diagnoses Final diagnoses:  Pharyngitis, unspecified etiology    Rx / DC Orders ED Discharge Orders     None        Sherrilee Gilles 12/29/20 2237    Mancel Bale, MD 12/30/20 1341

## 2020-12-29 NOTE — Discharge Instructions (Addendum)
You were seen in the ER today for your sore throat. You tested negative for COVID-19 and influenza. You likely have another viral URI.   Continue use Tylenol / ibuprofen as needed for symptoms.  You administered Decadron which is a type of steroid in the emergency department help with the swelling inflammation of your tonsils.  Return to ER if you develop any difficulty breathing, nausea or vomiting that does not stop, or any other new CV symptom

## 2021-01-04 DIAGNOSIS — H6501 Acute serous otitis media, right ear: Secondary | ICD-10-CM | POA: Insufficient documentation

## 2021-01-04 DIAGNOSIS — R051 Acute cough: Secondary | ICD-10-CM | POA: Insufficient documentation

## 2021-01-04 DIAGNOSIS — J029 Acute pharyngitis, unspecified: Secondary | ICD-10-CM | POA: Insufficient documentation

## 2021-01-24 DIAGNOSIS — N898 Other specified noninflammatory disorders of vagina: Secondary | ICD-10-CM | POA: Insufficient documentation

## 2021-03-20 IMAGING — US US PELVIS COMPLETE
1 series · 13 of 25 positions shown · non-contrast
Comparison: None.

CLINICAL DATA: Initial evaluation for pelvic pain with vaginal
bleeding for several weeks.

EXAM:
TRANSABDOMINAL ULTRASOUND OF PELVIS
DOPPLER ULTRASOUND OF OVARIES
TECHNIQUE: Transabdominal ultrasound examination of the pelvis was performed
including evaluation of the uterus, ovaries, adnexal regions, and
pelvic cul-de-sac.
Color and duplex Doppler ultrasound was utilized to evaluate blood
flow to the ovaries.

[Series 1: us pelvis (transabdominal only) · 13 of 49 slices shown]
[im 1/49]
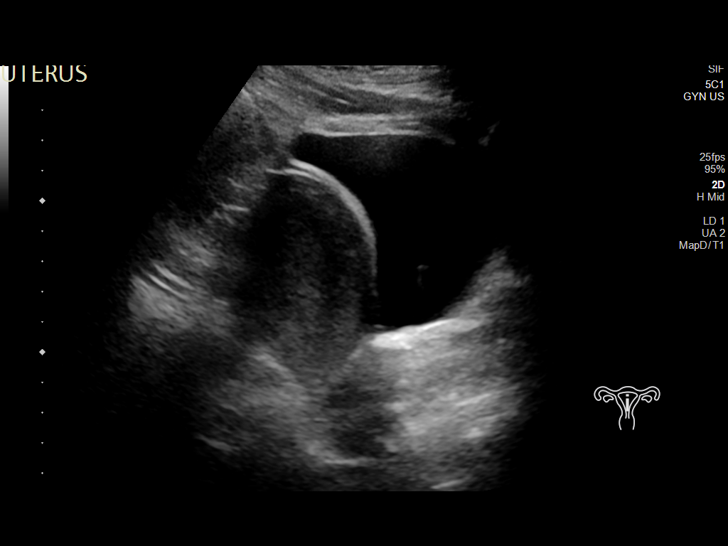
[im 5/49]
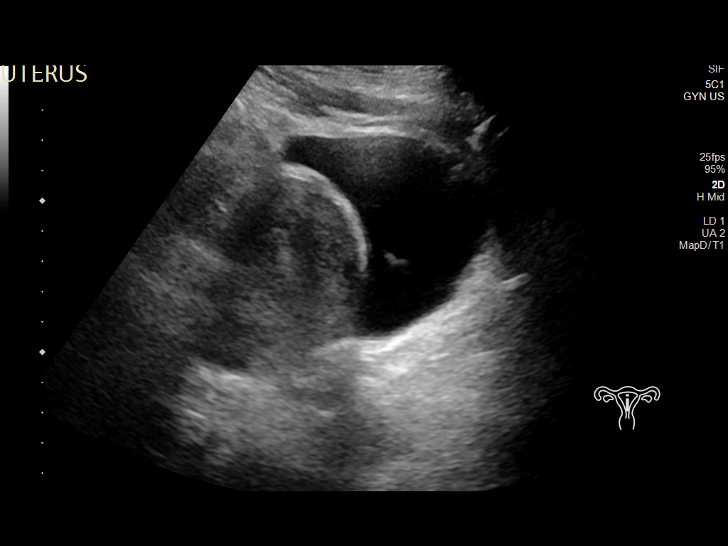
[im 9/49]
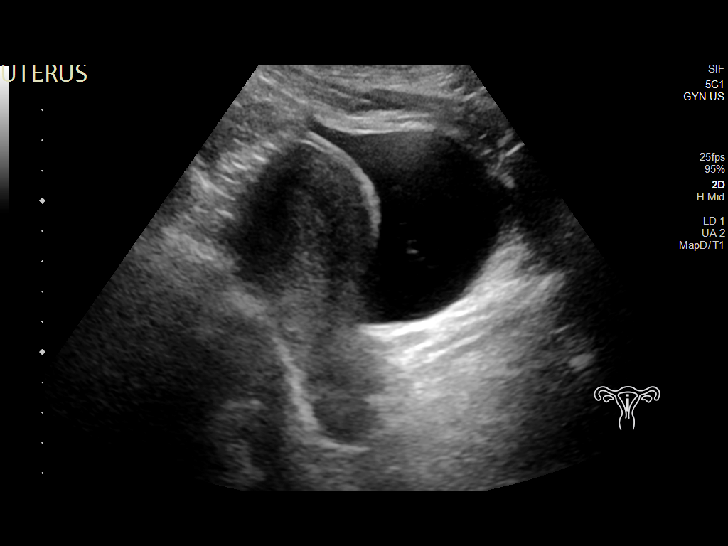
[im 13/49]
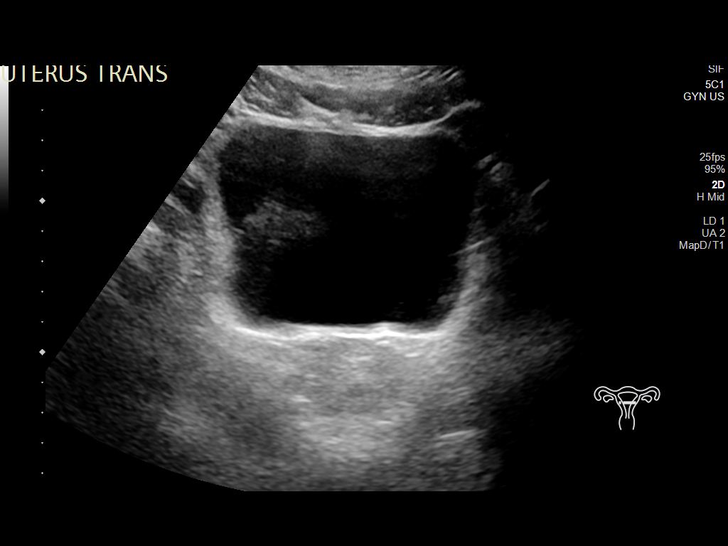
[im 17/49]
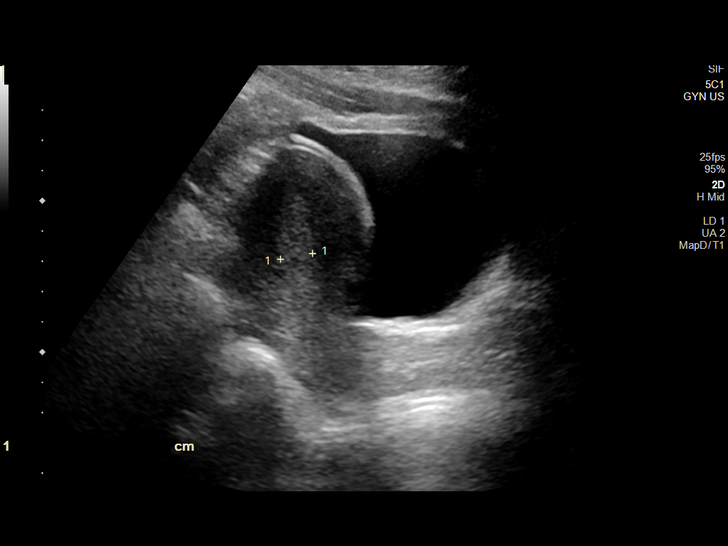
[im 21/49]
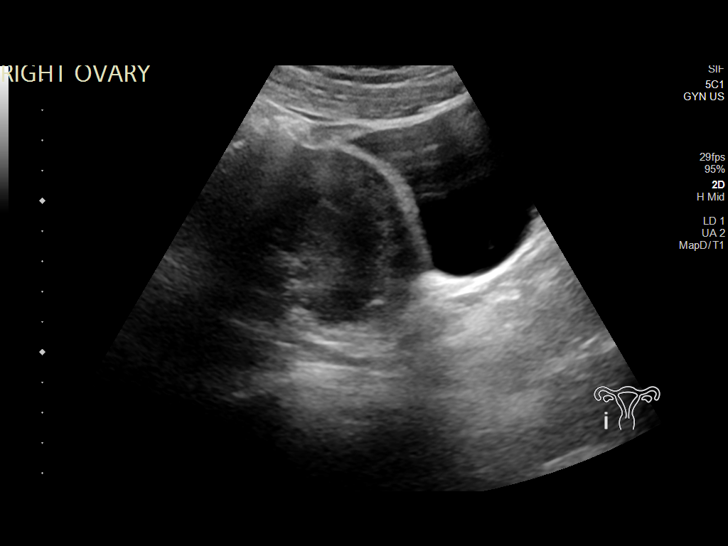
[im 25/49]
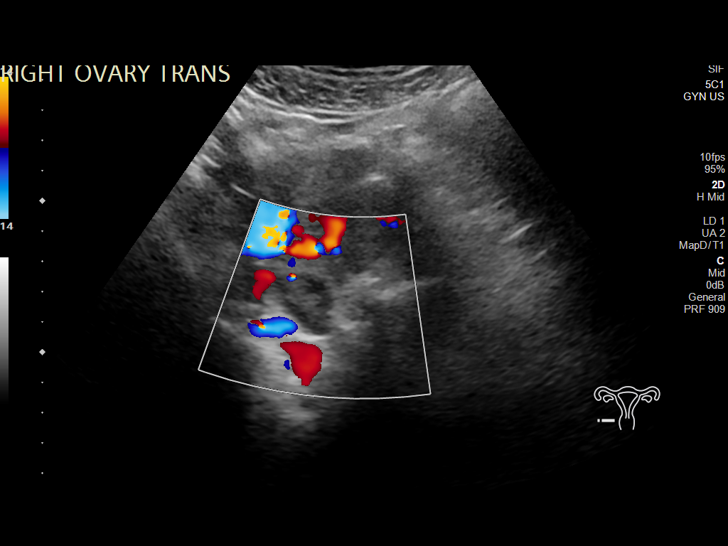
[im 29/49]
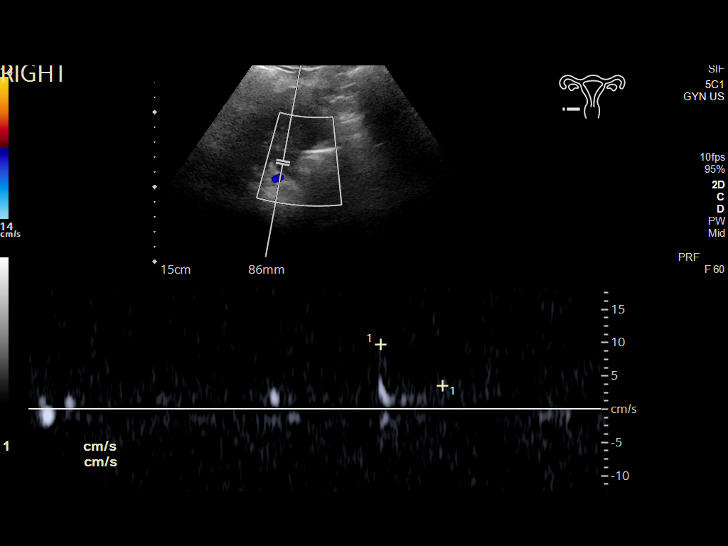
[im 33/49]
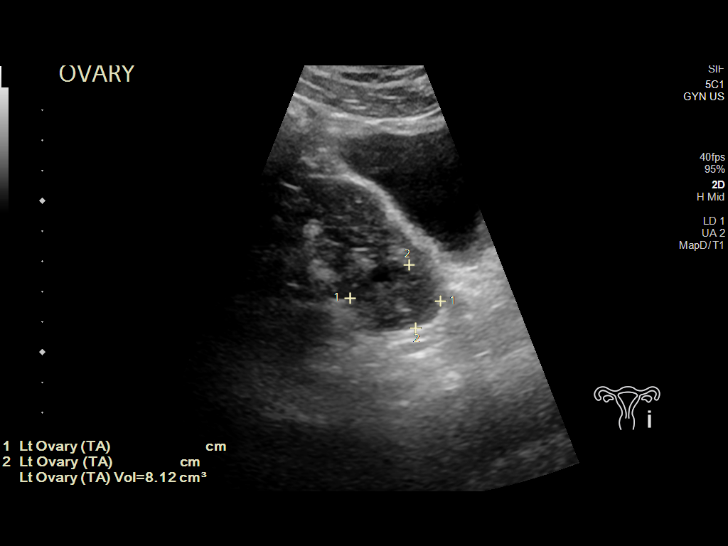
[im 37/49]
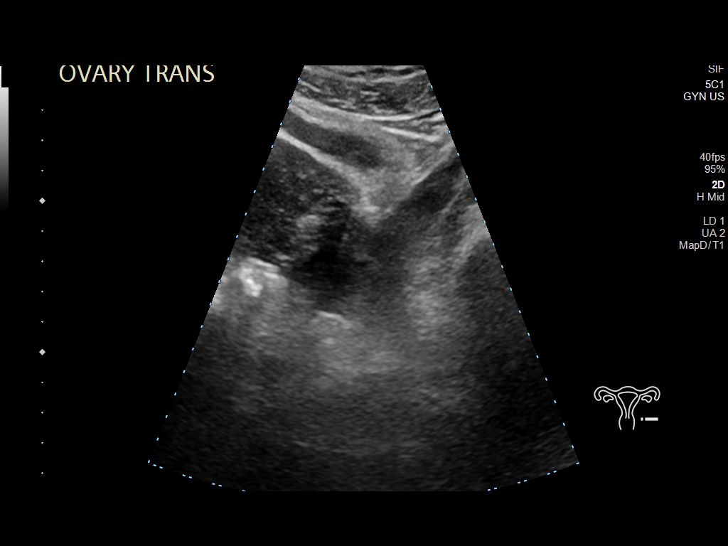
[im 41/49]
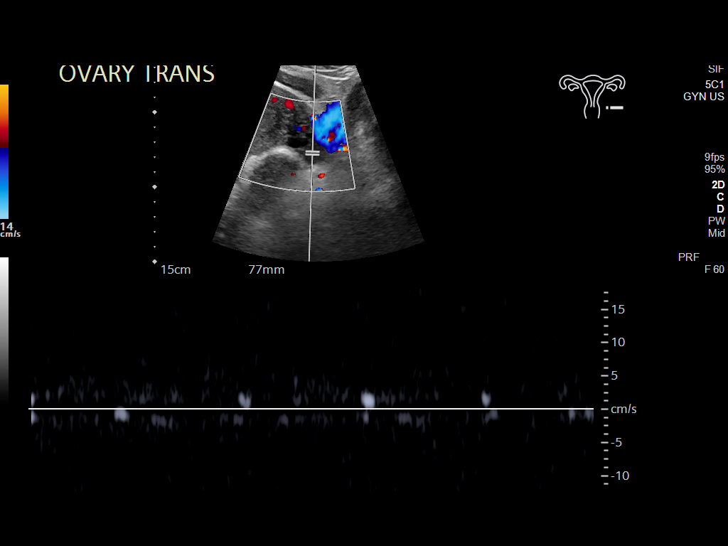
[im 45/49]
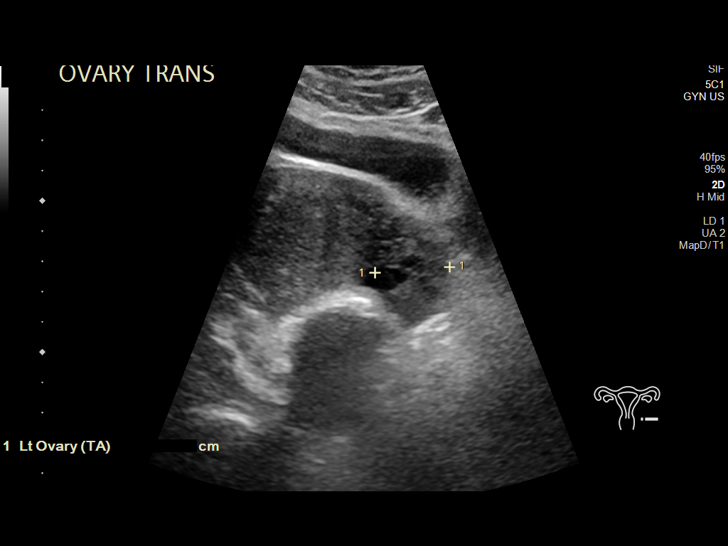
[im 49/49]
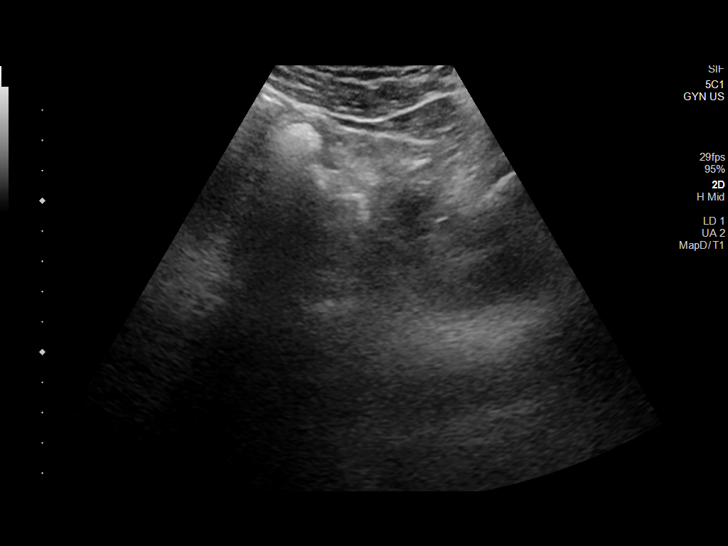

[13 of 25 positions shown; findings below may reference images not displayed]

FINDINGS: Uterus

Measurements: 10.0 x 4.7 x 6.8 cm = volume: 166.4 mL. No fibroids or
other mass visualized.

Endometrium

Thickness: 11 mm.  No focal abnormality visualized.

Right ovary

Measurements: 2.7 x 1.7 x 1.8 cm = volume: 4.2 mL. Normal
appearance/no adnexal mass.

Left ovary

Measurements: 3.0 x 2.1 x 2.5 cm = volume: 8.1 mL. Normal
appearance/no adnexal mass.

Pulsed Doppler evaluation demonstrates normal low-resistance
arterial and venous waveforms in both ovaries.

Other: No free fluid seen within the pelvis.
IMPRESSION: 1. Negative pelvic ultrasound. No acute abnormality or findings to
explain patient's symptoms identified.
2. Endometrial stripe measures 11 mm in thickness. If bleeding
remains unresponsive to hormonal or medical therapy, sonohysterogram
should be considered for focal lesion work-up. (Ref: Radiological
Reasoning: Algorithmic Workup of Abnormal Vaginal Bleeding with
Endovaginal Sonography and Sonohysterography. AJR [id number]; [id number]:S68-73).

## 2021-03-25 ENCOUNTER — Telehealth: Payer: Self-pay

## 2021-03-25 NOTE — Telephone Encounter (Signed)
LMOM to call back to schedule appointment

## 2021-03-29 ENCOUNTER — Ambulatory Visit: Payer: BC Managed Care – PPO

## 2021-03-29 ENCOUNTER — Other Ambulatory Visit: Payer: Self-pay

## 2021-03-29 ENCOUNTER — Encounter: Payer: Self-pay | Admitting: Podiatry

## 2021-03-29 ENCOUNTER — Ambulatory Visit (INDEPENDENT_AMBULATORY_CARE_PROVIDER_SITE_OTHER): Payer: BC Managed Care – PPO | Admitting: Podiatry

## 2021-03-29 DIAGNOSIS — M7661 Achilles tendinitis, right leg: Secondary | ICD-10-CM

## 2021-03-29 DIAGNOSIS — M7662 Achilles tendinitis, left leg: Secondary | ICD-10-CM

## 2021-03-29 DIAGNOSIS — F5089 Other specified eating disorder: Secondary | ICD-10-CM | POA: Insufficient documentation

## 2021-03-29 DIAGNOSIS — F5083 Pica in adults: Secondary | ICD-10-CM | POA: Insufficient documentation

## 2021-03-29 DIAGNOSIS — R51 Headache: Secondary | ICD-10-CM | POA: Insufficient documentation

## 2021-03-29 MED ORDER — DICLOFENAC SODIUM 75 MG PO TBEC: 75.0000 mg | DELAYED_RELEASE_TABLET | Freq: Two times a day (BID) | ORAL | 2 refills | Status: DC

## 2021-03-29 NOTE — Patient Instructions (Signed)

## 2021-03-29 NOTE — Progress Notes (Signed)
Subjective:   Patient ID: Amy Herring, female   DOB: 26 y.o.   MRN: [id number]   HPI Patient presents stating her orthotics have started to lose their support quickly and she is getting pain in the back of her heels and she is working 24-hour shifts at times and just having trouble secondary to this.  Patient has done well with orthotics in the past but frustrated with how quickly they have gone down and does not smoke   Review of Systems  All other systems reviewed and are negative.      Objective:  Physical Exam Vitals and nursing note reviewed.  Constitutional:      Appearance: She is well-developed.  Pulmonary:     Effort: Pulmonary effort is normal.  Musculoskeletal:        General: Normal range of motion.  Skin:    General: Skin is warm.  Neurological:     Mental Status: She is alert.    Neurovascular status intact muscle strength adequate range of motion adequate with pain in the posterior aspect of the heel bilateral at the muscle tendon junction not at the insertion with mild equinus condition bilateral.  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  I do think she could have a stronger material done and these were done with and it may last her longer and also when these are rehabilitated I have recommended adding 3/16inch of heel lift to both orthotics.  She will see Arlys John in the next 2 weeks and go over this with him and show him the old orthotics and consideration of new orthotics or rehabilitating old orthotics

## 2021-04-22 ENCOUNTER — Other Ambulatory Visit: Payer: BC Managed Care – PPO

## 2021-05-13 ENCOUNTER — Telehealth: Payer: Self-pay | Admitting: Physician Assistant

## 2021-05-13 NOTE — Telephone Encounter (Signed)
Scheduled appt per 4/6 referral. Pt is aware of appt date and time. Pt is aware to arrive 15 mins prior to appt time and to bring and updated insurance card. Pt is aware of appt location.   ?

## 2021-05-23 NOTE — Progress Notes (Deleted)
[facility] [phone]   [phone]  CONSULT NOTE  REFERRING PHYSICIAN: Rondel Baton PA  REASON FOR CONSULTATION:  Iron Deficiency Anemia  HPI Amy Herring is a 26 y.o. female with a past medical history significant for abnormal uterine bleeding and iron deficiency anemia is referred to the clinic for iron deficiency anemia.  The patient was noted to be mildly anemic in December 2022 in January 2023 with a hemoglobin of 10.5 and 10.2 respectively.  The patient was told to increase her iron supplement.  She has been taking  ***milligrams  ***since approximately  ***. the patient recently had lab work performed at her PCPs office which showed improvement in her anemia with a hemoglobin within normal limits at 11.5 but persistent iron deficiency with a ferritin of 7, saturation of 7%, and a low iron at 28.  She was referred to the clinic for further evaluation and recommendations regarding this finding.   Per chart review, the oldest records available to me are from 2019.  She had intermittent periods of mild anemia in 2021-2022. The patient has never had a colonoscopy or endoscopy due to lack of symptoms and age for routine colonoscopy. Other vitamin def. Donate blood. Every need IV iron or blood before? Bariatric surgery.    The patient reports she has been [place] she has been taking iron supplements since ***.  Today, the patient reports frequent fatigue, dyspnea on exertion, occasional lightheadedness, and*** . Palpitations ***.   Regarding her menstrual history, she ***.  She has an OB/GYN and follows with ***.  She has a menstrual cycle every ***  days.  In general, she does *** have heavy menstrual cycles. She states that she had to uses a **** and was bleeding through every *** hours or so.  Her menstrual cycle started ***  She reports clots.    Denies any blood thinner use.  She denies epistaxis, gingival bleeding, hemoptysis, hematemesis, or  melena.   Denies any NSAID use. Denies any chronic kidney disease.  She denies any particular dietary habits such as being a vegan or vegetarian.  She reports she *** craves ice chips.   HPI  No past medical history on file.  Past Surgical History:  Procedure Laterality Date   SIGMOIDOSCOPY  11/26/2015    Family History  Problem Relation Age of Onset   Healthy Mother    Healthy Father     Social History Social History   Tobacco Use   Smoking status: Never   Smokeless tobacco: Never  Substance Use Topics   Alcohol use: No   Drug use: No    No Known Allergies  Current Outpatient Medications  Medication Sig Dispense Refill   diclofenac (VOLTAREN) 75 MG EC tablet Take 1 tablet (75 mg total) by mouth 2 (two) times daily. 50 tablet 2   No current facility-administered medications for this visit.    REVIEW OF SYSTEMS:   Review of Systems  Constitutional: Negative for appetite change, chills, fatigue, fever and unexpected weight change.  HENT:   Negative for mouth sores, nosebleeds, sore throat and trouble swallowing.   Eyes: Negative for eye problems and icterus.  Respiratory: Negative for cough, hemoptysis, shortness of breath and wheezing.   Cardiovascular: Negative for chest pain and leg swelling.  Gastrointestinal: Negative for abdominal pain, constipation, diarrhea, nausea and vomiting.  Genitourinary: Negative for bladder incontinence, difficulty urinating, dysuria, frequency and hematuria.   Musculoskeletal: Negative for back pain, gait problem, neck pain  and neck stiffness.  Skin: Negative for itching and rash.  Neurological: Negative for dizziness, extremity weakness, gait problem, headaches, light-headedness and seizures.  Hematological: Negative for adenopathy. Does not bruise/bleed easily.  Psychiatric/Behavioral: Negative for confusion, depression and sleep disturbance. The patient is not nervous/anxious.     PHYSICAL EXAMINATION:  There were no vitals  taken for this visit.  ECOG PERFORMANCE STATUS: {CHL ONC ECOG [id number]  Physical Exam  Constitutional: Oriented to person, place, and time and well-developed, well-nourished, and in no distress. No distress.  HENT:  Head: Normocephalic and atraumatic.  Mouth/Throat: Oropharynx is clear and moist. No oropharyngeal exudate.  Eyes: Conjunctivae are normal. Right eye exhibits no discharge. Left eye exhibits no discharge. No scleral icterus.  Neck: Normal range of motion. Neck supple.  Cardiovascular: Normal rate, regular rhythm, normal heart sounds and intact distal pulses.   Pulmonary/Chest: Effort normal and breath sounds normal. No respiratory distress. No wheezes. No rales.  Abdominal: Soft. Bowel sounds are normal. Exhibits no distension and no mass. There is no tenderness.  Musculoskeletal: Normal range of motion. Exhibits no edema.  Lymphadenopathy:    No cervical adenopathy.  Neurological: Alert and oriented to person, place, and time. Exhibits normal muscle tone. Gait normal. Coordination normal.  Skin: Skin is warm and dry. No rash noted. Not diaphoretic. No erythema. No pallor.  Psychiatric: Mood, memory and judgment normal.  Vitals reviewed.  LABORATORY DATA: Lab Results  Component Value Date   WBC 6.9 09/01/2019   HGB 10.6 (L) 09/01/2019   HCT 35.7 (L) 09/01/2019   MCV 85.8 09/01/2019   PLT 327 09/01/2019      Chemistry      Component Value Date/Time   NA 137 09/01/2019 1010   K 3.8 09/01/2019 1010   CL 105 09/01/2019 1010   CO2 21 (L) 09/01/2019 1010   BUN 10 09/01/2019 1010   CREATININE 0.91 09/01/2019 1010      Component Value Date/Time   CALCIUM 9.3 09/01/2019 1010       RADIOGRAPHIC STUDIES: No results found.  ASSESSMENT: This is a very pleasant 51 female referred to clinic for iron deficiency anemia.    The patient was seen with Dr. Julien Nordmann today.  The patient's had several lab studies performed including a CBC, CMP, iron studies, ferritin,  vitamin 123456, folic acid, and SPEP with immunofixation.     The patient's labs from today demonstrate: Mild normocytic anemia with a hemoglobin of ***.  Her CMP is unremarkable.  Her B12 is within normal limits.  Her iron studies and ferritin continue to show iron deficiency***.   The patient was seen with Dr. Julien Nordmann today.  We recommend that the patient undergo weekly iron infusions x3 with Venofer 300 mg.  I will arrange for her first infusion to be next week at the [facility]. infusion center.  She was also encouraged to continue taking her oral iron supplement if possible.  She was encouraged to take this with vitamin C to help with absorption.  She was also given a handout on iron rich food and was encouraged to increase her dietary intake of iron.  We will see her back for follow-up visit in 3 months for evaluation and repeat CBC, iron studies, and ferritin.   The patient voices understanding of current disease status and treatment options and is in agreement with the current care plan.  All questions were answered. The patient knows to call the clinic with any problems, questions or concerns. We can  certainly see the patient much sooner if necessary.  Thank you so much for allowing me to participate in the care of Amy Herring. I will continue to follow up the patient with you and assist in her care.  I spent {CHL ONC TIME VISIT - [id number] counseling the patient face to face. The total time spent in the appointment was {CHL ONC TIME VISIT - [id number].  Disclaimer: This note was dictated with voice recognition software. Similar sounding words can inadvertently be transcribed and may not be corrected upon review.   Teagan Heidrick L Lauren Modisette May 23, 2021, 8:34 AM

## 2021-05-24 ENCOUNTER — Other Ambulatory Visit: Payer: Self-pay | Admitting: Physician Assistant

## 2021-05-24 DIAGNOSIS — D509 Iron deficiency anemia, unspecified: Secondary | ICD-10-CM

## 2021-05-27 ENCOUNTER — Telehealth: Payer: Self-pay | Admitting: Physician Assistant

## 2021-05-27 ENCOUNTER — Inpatient Hospital Stay: Payer: BC Managed Care – PPO

## 2021-05-27 ENCOUNTER — Inpatient Hospital Stay: Payer: BC Managed Care – PPO | Admitting: Physician Assistant

## 2021-05-27 NOTE — Telephone Encounter (Signed)
Pt called and cancelled appt for today, pt said they will call back to r/s ?

## 2022-05-08 ENCOUNTER — Ambulatory Visit: Payer: BC Managed Care – PPO | Admitting: Student

## 2022-05-08 ENCOUNTER — Encounter: Payer: Self-pay | Admitting: Student

## 2022-05-08 VITALS — BP 135/88 | HR 99 | Ht 67.0 in

## 2022-05-08 DIAGNOSIS — I059 Rheumatic mitral valve disease, unspecified: Secondary | ICD-10-CM | POA: Insufficient documentation

## 2022-05-08 DIAGNOSIS — Z0000 Encounter for general adult medical examination without abnormal findings: Secondary | ICD-10-CM | POA: Diagnosis not present

## 2022-05-08 DIAGNOSIS — N939 Abnormal uterine and vaginal bleeding, unspecified: Secondary | ICD-10-CM

## 2022-05-08 DIAGNOSIS — D500 Iron deficiency anemia secondary to blood loss (chronic): Secondary | ICD-10-CM | POA: Diagnosis not present

## 2022-05-08 NOTE — Progress Notes (Signed)
    SUBJECTIVE:   CHIEF COMPLAINT / HPI:   Amy Herring is a 27 y.o. female  presenting to establish care as a new patient. She is wanting to get screening labs today.   Pap smear UTD, receives care at [facility], usually has to be put under for procedures.   Hx of iron deficiency anemia 2/2 to dysmenorrhea: has received blood transfusions in the past due to heavy menstrual bleeding. Has tried birth control patch and Implanon without get tolerance. She reports not having a cycle for several months.   History of mitral valve prolapse. Echo done in [place] prior. She has not followed up with cardiology since she was 27 y/o. Denies SOB, palpitations, chest pain.   PERTINENT  PMH / PSH: Reviewed and Updated   OBJECTIVE:   BP 135/88   Pulse 99   Ht 5\' 7"  (1.702 m)   SpO2 100%   BMI 28.19 kg/m   Well-appearing, no acute distress Cardio: Regular rate, regular rhythm, no murmurs on exam. Pulm: Clear, no wheezing, no crackles. No increased work of breathing Abdominal: bowel sounds present, soft, non-tender, non-distended Extremities: no peripheral edema    ASSESSMENT/PLAN:   Mitral valve disorder Did not order TTE due to patient being stable clinically. Can consider further study if she complains of palpitations or SOB.   Abnormal uterine bleeding (AUB) Counseled patient on birth control options. She will discuss this with her OB/GYN at next visit.   Iron deficiency anemia due to chronic blood loss Ordered CBC and iron panel   Health Maintenance:  - Hep C and HIV ordered  - pap smear UTD at [facility] [place]

## 2022-05-08 NOTE — Assessment & Plan Note (Signed)
Counseled patient on birth control options. She will discuss this with her OB/GYN at next visit.

## 2022-05-08 NOTE — Patient Instructions (Signed)
It was great to see you today!   Today we addressed: Labs - I will send you a letter with your results.  I am going to request records for your echo and then we can evaluate your need for repeat imaging.   You should return to our clinic Return in about 6 months (around 11/07/2022).  Please arrive 15 minutes before your appointment to ensure smooth check in process.    Please call the clinic at [phone] if your symptoms worsen or you have any concerns.  Thank you for allowing me to participate in your care, Dr. Darci Current [facility]

## 2022-05-08 NOTE — Assessment & Plan Note (Signed)
Did not order TTE due to patient being stable clinically. Can consider further study if she complains of palpitations or SOB.

## 2022-05-08 NOTE — Assessment & Plan Note (Signed)
Ordered CBC and iron panel

## 2022-05-09 LAB — CBC
Hematocrit: 36.1 % (ref 34.0–46.6)
Hemoglobin: 11 g/dL — ABNORMAL LOW (ref 11.1–15.9)
MCH: 25.3 pg — ABNORMAL LOW (ref 26.6–33.0)
MCHC: 30.5 g/dL — ABNORMAL LOW (ref 31.5–35.7)
MCV: 83 fL (ref 79–97)
Platelets: 263 10*3/uL (ref 150–450)
RBC: 4.34 x10E6/uL (ref 3.77–5.28)
RDW: 14.3 % (ref 11.7–15.4)
WBC: 4.7 10*3/uL (ref 3.4–10.8)

## 2022-05-09 LAB — BASIC METABOLIC PANEL
BUN/Creatinine Ratio: 17 (ref 9–23)
BUN: 15 mg/dL (ref 6–20)
CO2: 23 mmol/L (ref 20–29)
Calcium: 9.4 mg/dL (ref 8.7–10.2)
Chloride: 104 mmol/L (ref 96–106)
Creatinine, Ser: 0.87 mg/dL (ref 0.57–1.00)
Glucose: 92 mg/dL (ref 70–99)
Potassium: 4.5 mmol/L (ref 3.5–5.2)
Sodium: 139 mmol/L (ref 134–144)
eGFR: 93 mL/min/{1.73_m2} (ref >59)

## 2022-05-09 LAB — IRON,TIBC AND FERRITIN PANEL
Ferritin: 8 ng/mL — ABNORMAL LOW (ref 15–150)
Iron Saturation: 13 % — ABNORMAL LOW (ref 15–55)
Iron: 48 ug/dL (ref 27–159)
Total Iron Binding Capacity: 373 ug/dL (ref 250–450)
UIBC: 325 ug/dL (ref 131–425)

## 2022-05-09 LAB — HIV ANTIBODY (ROUTINE TESTING W REFLEX): HIV Screen 4th Generation wRfx: NONREACTIVE

## 2022-05-09 LAB — RPR: RPR Ser Ql: NONREACTIVE

## 2022-05-09 LAB — HEPATITIS C ANTIBODY: Hep C Virus Ab: NONREACTIVE

## 2023-01-09 ENCOUNTER — Ambulatory Visit (INDEPENDENT_AMBULATORY_CARE_PROVIDER_SITE_OTHER): Payer: 59 | Admitting: Family Medicine

## 2023-01-09 ENCOUNTER — Other Ambulatory Visit (HOSPITAL_COMMUNITY)
Admission: RE | Admit: 2023-01-09 | Discharge: 2023-01-09 | Disposition: A | Discharge status: Home / Self Care | Payer: 59 | Source: Ambulatory Visit | Attending: Family Medicine | Admitting: Family Medicine

## 2023-01-09 ENCOUNTER — Encounter: Payer: Self-pay | Admitting: Family Medicine

## 2023-01-09 VITALS — BP 115/88 | HR 85 | Ht 67.0 in | Wt 200.0 lb

## 2023-01-09 DIAGNOSIS — G43809 Other migraine, not intractable, without status migrainosus: Secondary | ICD-10-CM | POA: Diagnosis not present

## 2023-01-09 DIAGNOSIS — N761 Subacute and chronic vaginitis: Secondary | ICD-10-CM | POA: Diagnosis not present

## 2023-01-09 DIAGNOSIS — Z6831 Body mass index (BMI) 31.0-31.9, adult: Secondary | ICD-10-CM

## 2023-01-09 DIAGNOSIS — N926 Irregular menstruation, unspecified: Secondary | ICD-10-CM | POA: Diagnosis not present

## 2023-01-09 DIAGNOSIS — D500 Iron deficiency anemia secondary to blood loss (chronic): Secondary | ICD-10-CM | POA: Diagnosis not present

## 2023-01-09 DIAGNOSIS — N939 Abnormal uterine and vaginal bleeding, unspecified: Secondary | ICD-10-CM | POA: Diagnosis not present

## 2023-01-09 DIAGNOSIS — D649 Anemia, unspecified: Secondary | ICD-10-CM | POA: Diagnosis not present

## 2023-01-09 DIAGNOSIS — N760 Acute vaginitis: Secondary | ICD-10-CM | POA: Insufficient documentation

## 2023-01-09 DIAGNOSIS — G43909 Migraine, unspecified, not intractable, without status migrainosus: Secondary | ICD-10-CM | POA: Insufficient documentation

## 2023-01-09 DIAGNOSIS — B9689 Other specified bacterial agents as the cause of diseases classified elsewhere: Secondary | ICD-10-CM | POA: Insufficient documentation

## 2023-01-09 NOTE — Assessment & Plan Note (Signed)
Anemia profile checked Currently asymptomatic I will contact her soon with her test result

## 2023-01-09 NOTE — Patient Instructions (Signed)
It was nice seeing you today. We did some blood test as discussed. I will call you soon with the results. For Migraine, please try Magnesium oxide 400 mg daily prn headache with Ibuprofen or Tylenol as needed. F/U with PCP soon if symptoms persists.

## 2023-01-09 NOTE — Assessment & Plan Note (Signed)
Etiology unclear ?? PCOS Lab checked today - TSH, LH, FSH, Testosterone, DHEAS I will call with her result She will reconnect with her GYN specialist

## 2023-01-09 NOTE — Assessment & Plan Note (Signed)
Hx of recurrent BV She self-swab for ancillary testing to include STD screening Consider Tinidazole if she has BV again I will call her soon with her test result

## 2023-01-09 NOTE — Progress Notes (Signed)
SUBJECTIVE:   CHIEF COMPLAINT / HPI:   Anemia/Irregular periods: Denies any symptoms currently. She has occasional blood in her stool due to hemorrhoids. Otherwise, no other GI symptoms. She endorsed irregular periods. LMP was 6 months ago. Periods sometimes last up to 6 months whenever she has it. She is not on birth control and is not currently sexually active. She had been on birth control in the past to regulate her period with f/u with Gyn. She usually f/u with [facility] for her exam, including PAP undergeneralized anesthesia, as she can't tolerate pelvic exams. Wanted to check urine for BV.  Vaginitis: Hx of recurrent BV. She takes oral and PV metronidazole prn symptoms. These meds do not work well for her now, and she wonders if she can try something else. She endorsed vaginal discharge x a few weeks now. No itching, no burning, no irritation. No dysuria or change in her urine color.    Headache: C/O HA x  3 days on and off. She attributed this to dehydration. However, she has been drinking about 1 liter of water daily. She has hx of Migraine headaches, which have improved in the past years. She was seen by a [facility] neurologist at the time. Her pain today is about 7/10 in severity. No major stress; however, she started a new job a few weeks ago. She denies N/V, no vision change or photophobia. She has tried Ibuprofen, Tylenol, and Gabapentin in the past, with mild improvement.   Lab work request: Wanted a bunch of other lab tests due to past abnormal results  - Testosterone, LDH, etc.  PERTINENT  PMH / PSH: PHX reviewed  OBJECTIVE:   BP 115/88   Pulse 85   Ht 5\' 7"  (1.702 m)   Wt 200 lb (90.7 kg) Comment: Patient declined - self reported weight of 200 lbs  LMP  (LMP Unknown)   SpO2 100%   BMI 31.32 kg/m   Physical Exam Vitals and nursing note reviewed.  Cardiovascular:     Rate and Rhythm: Normal rate and regular rhythm.     Pulses: Normal pulses.     Heart sounds:  Normal heart sounds. No murmur heard. Pulmonary:     Effort: Pulmonary effort is normal. No respiratory distress.     Breath sounds: Normal breath sounds. No wheezing or rhonchi.  Abdominal:     General: Abdomen is flat. Bowel sounds are normal. There is no distension.     Palpations: Abdomen is soft.     Tenderness: There is no abdominal tenderness.  Genitourinary:    Comments: Deferred - needed generalized anesthesia for pelvic exam Neurological:     General: No focal deficit present.      ASSESSMENT/PLAN:   Iron deficiency anemia due to chronic blood loss Anemia profile checked Currently asymptomatic I will contact her soon with her test result  Abnormal uterine bleeding (AUB) Etiology unclear ?? PCOS Lab checked today - TSH, LH, FSH, Testosterone, DHEAS I will call with her result She will reconnect with her GYN specialist  Vaginitis Hx of recurrent BV She self-swab for ancillary testing to include STD screening Consider Tinidazole if she has BV again I will call her soon with her test result  Migraine No neurologic deficit I discuss adding Magnesium oxide 400 mg prn with her Ibuprofen Red flag signs and indication for ED visit for neuro-imaging discussed She agreed with the plan     Janit Pagan, MD [facility]

## 2023-01-09 NOTE — Assessment & Plan Note (Signed)
No neurologic deficit I discuss adding Magnesium oxide 400 mg prn with her Ibuprofen Red flag signs and indication for ED visit for neuro-imaging discussed She agreed with the plan

## 2023-01-10 MED ORDER — FERROUS GLUCONATE 324 (38 FE) MG PO TABS: 324.0000 mg | ORAL_TABLET | Freq: Every day | ORAL | 1 refills | Status: AC

## 2023-01-10 NOTE — Addendum Note (Signed)
Addended by: Janit Pagan T on: 01/10/2023 09:40 PM   Modules accepted: Orders

## 2023-01-11 LAB — TSH+FSH+TESTT+LH+T3+DHEA-S+...
DHEA-SO4: 146 ug/dL (ref 84.8–378.0)
Estradiol: 42.7 pg/mL
FSH: 6 m[IU]/mL
LH: 10.1 m[IU]/mL
Progesterone: 0.2 ng/mL
T3, Total: 116 ng/dL (ref 71–180)
TSH: 0.929 u[IU]/mL (ref 0.450–4.500)
Testosterone, Free: 1 pg/mL (ref 0.0–4.2)
Testosterone: 45 ng/dL (ref 13–71)

## 2023-01-11 LAB — CMP14+EGFR
ALT: 10 [IU]/L (ref 0–32)
AST: 15 [IU]/L (ref 0–40)
Albumin: 4.5 g/dL (ref 4.0–5.0)
Alkaline Phosphatase: 88 [IU]/L (ref 44–121)
BUN/Creatinine Ratio: 11 (ref 9–23)
BUN: 10 mg/dL (ref 6–20)
Bilirubin Total: 0.3 mg/dL (ref 0.0–1.2)
CO2: 22 mmol/L (ref 20–29)
Calcium: 9.9 mg/dL (ref 8.7–10.2)
Chloride: 100 mmol/L (ref 96–106)
Creatinine, Ser: 0.89 mg/dL (ref 0.57–1.00)
Globulin, Total: 3.1 g/dL (ref 1.5–4.5)
Glucose: 85 mg/dL (ref 70–99)
Potassium: 4.7 mmol/L (ref 3.5–5.2)
Sodium: 136 mmol/L (ref 134–144)
Total Protein: 7.6 g/dL (ref 6.0–8.5)
eGFR: 90 mL/min/{1.73_m2} (ref >59)

## 2023-01-11 LAB — ANEMIA PROFILE B
Basophils Absolute: 0 10*3/uL (ref 0.0–0.2)
Basos: 1 %
EOS (ABSOLUTE): 0.2 10*3/uL (ref 0.0–0.4)
Eos: 4 %
Ferritin: 8 ng/mL — ABNORMAL LOW (ref 15–150)
Folate: 9 ng/mL (ref >3.0)
Hematocrit: 37.8 % (ref 34.0–46.6)
Hemoglobin: 11.9 g/dL (ref 11.1–15.9)
Immature Grans (Abs): 0 10*3/uL (ref 0.0–0.1)
Immature Granulocytes: 0 %
Iron Saturation: 13 % — ABNORMAL LOW (ref 15–55)
Iron: 52 ug/dL (ref 27–159)
Lymphocytes Absolute: 1.4 10*3/uL (ref 0.7–3.1)
Lymphs: 28 %
MCH: 26.4 pg — ABNORMAL LOW (ref 26.6–33.0)
MCHC: 31.5 g/dL (ref 31.5–35.7)
MCV: 84 fL (ref 79–97)
Monocytes Absolute: 0.5 10*3/uL (ref 0.1–0.9)
Monocytes: 9 %
Neutrophils Absolute: 3 10*3/uL (ref 1.4–7.0)
Neutrophils: 58 %
Platelets: 282 10*3/uL (ref 150–450)
RBC: 4.5 x10E6/uL (ref 3.77–5.28)
RDW: 13.4 % (ref 11.7–15.4)
Retic Ct Pct: 1.3 % (ref 0.6–2.6)
Total Iron Binding Capacity: 403 ug/dL (ref 250–450)
UIBC: 351 ug/dL (ref 131–425)
Vitamin B-12: 1699 pg/mL — ABNORMAL HIGH (ref 232–1245)
WBC: 5.2 10*3/uL (ref 3.4–10.8)

## 2023-01-11 LAB — LACTATE DEHYDROGENASE: LDH: 185 [IU]/L (ref 119–226)

## 2023-01-12 ENCOUNTER — Encounter: Payer: Self-pay | Admitting: Family Medicine

## 2023-01-12 LAB — CERVICOVAGINAL ANCILLARY ONLY
Bacterial Vaginitis (gardnerella): POSITIVE — AB
Candida Glabrata: NEGATIVE
Candida Vaginitis: NEGATIVE
Chlamydia: NEGATIVE
Comment: NEGATIVE
Comment: NEGATIVE
Comment: NEGATIVE
Comment: NEGATIVE
Comment: NEGATIVE
Comment: NORMAL
Neisseria Gonorrhea: NEGATIVE
Trichomonas: NEGATIVE

## 2023-01-12 MED ORDER — TINIDAZOLE 500 MG PO TABS: 2.0000 g | ORAL_TABLET | Freq: Every day | ORAL | 0 refills | Status: AC

## 2023-01-12 NOTE — Addendum Note (Signed)
Addended by: Janit Pagan T on: 01/12/2023 01:49 PM   Modules accepted: Orders

## 2023-01-12 NOTE — Progress Notes (Signed)
+  BV report -  Patient contacted via MyChart Tinidazole escribed F/U for repeat testing if symptoms persists or re-occurs.

## 2023-01-13 MED ORDER — FLUCONAZOLE 150 MG PO TABS
150.0000 mg | ORAL_TABLET | Freq: Every day | ORAL | 0 refills | Status: AC
Start: 2023-01-13 — End: 2023-01-14

## 2023-02-26 ENCOUNTER — Other Ambulatory Visit (HOSPITAL_COMMUNITY): Payer: Self-pay

## 2023-02-26 MED ORDER — WEGOVY 0.25 MG/0.5ML ~~LOC~~ SOAJ
SUBCUTANEOUS | 0 refills | Status: AC
  Filled 2023-02-26: qty 2, 30d supply, fill #0

## 2023-04-28 ENCOUNTER — Ambulatory Visit: Admitting: Student

## 2023-04-28 ENCOUNTER — Other Ambulatory Visit (HOSPITAL_COMMUNITY)
Admission: RE | Admit: 2023-04-28 | Discharge: 2023-04-28 | Disposition: A | Discharge status: Home / Self Care | Source: Ambulatory Visit | Attending: Family Medicine | Admitting: Family Medicine

## 2023-04-28 ENCOUNTER — Encounter: Payer: Self-pay | Admitting: Student

## 2023-04-28 ENCOUNTER — Other Ambulatory Visit: Payer: Self-pay | Admitting: Student

## 2023-04-28 VITALS — BP 123/81 | HR 80

## 2023-04-28 DIAGNOSIS — B9689 Other specified bacterial agents as the cause of diseases classified elsewhere: Secondary | ICD-10-CM

## 2023-04-28 DIAGNOSIS — B379 Candidiasis, unspecified: Secondary | ICD-10-CM

## 2023-04-28 DIAGNOSIS — Z113 Encounter for screening for infections with a predominantly sexual mode of transmission: Secondary | ICD-10-CM | POA: Insufficient documentation

## 2023-04-28 DIAGNOSIS — Z3009 Encounter for other general counseling and advice on contraception: Secondary | ICD-10-CM

## 2023-04-28 LAB — POCT WET PREP (WET MOUNT)
Clue Cells Wet Prep Whiff POC: POSITIVE
Trichomonas Wet Prep HPF POC: ABSENT

## 2023-04-28 MED ORDER — METRONIDAZOLE 500 MG PO TABS: 500.0000 mg | ORAL_TABLET | Freq: Two times a day (BID) | ORAL | 0 refills | Status: DC

## 2023-04-28 MED ORDER — TINIDAZOLE 500 MG PO TABS: 1.0000 g | ORAL_TABLET | Freq: Every day | ORAL | 0 refills | Status: AC

## 2023-04-28 MED ORDER — FLUCONAZOLE 150 MG PO TABS: 150.0000 mg | ORAL_TABLET | Freq: Once | ORAL | 0 refills | Status: AC

## 2023-04-28 NOTE — Progress Notes (Signed)
    SUBJECTIVE:   CHIEF COMPLAINT / HPI:   Ayshia Gramlich is a 28 y.o. female  presenting for STI testing.  Patient is interested in obtaining a new sexual partner.  She needs updated STI testing.  She has not currently been sexually active.  She is not currently on birth control.  No other concerns.  PERTINENT  PMH / PSH: Reviewed and updated   OBJECTIVE:   BP 123/81   Pulse 80   SpO2 100%   Well-appearing, no acute distress Cardio: Regular rate, regular rhythm, no murmurs on exam. Pulm: Clear, no wheezing, no crackles. No increased work of breathing Abdominal: bowel sounds present, soft, non-tender, non-distended    ASSESSMENT/PLAN:   Screening for STI: Patient declines pelvic exam.  She normally has a Pap smear under general anesthesia.  Will obtain urine GC, self swab wet prep, HIV and RPR.  Results via MyChart.  Wet prep positive for BV, will send in prescription for metronidazole and diflucan to  treat.  Patient overfilled urine sample and it had to be discarded. Patient will call and reschedule for lab visit.   Problem List Items Addressed This Visit       Other   Birth control counseling   Not currently on birth control and about to have a new sexual partner.  Discussed using barrier methods to prevent pregnancy and transmission of STIs.  Also discussed nonhormonal options for birth control including copper IUD.  Patient to think about this and discussed this with her OB/GYN next time she has a Pap smear in her general esthesia to have copper IUD placed.       Glendale Chard, DO [facility]

## 2023-04-28 NOTE — Patient Instructions (Signed)
 I am testing you for STDs today. Your results will be available via MyChart.

## 2023-04-28 NOTE — Progress Notes (Signed)
 Treatment for BV sent to pharmacy metronidazole 500 mg twice daily for 7 days.  Also send in treatment for yeast infection as patient is prone to getting these after antibiotic course.  Glendale Chard, DO [facility], PGY-2 04/28/23 3:26 PM

## 2023-04-28 NOTE — Assessment & Plan Note (Signed)
 Not currently on birth control and about to have a new sexual partner.  Discussed using barrier methods to prevent pregnancy and transmission of STIs.  Also discussed nonhormonal options for birth control including copper IUD.  Patient to think about this and discussed this with her OB/GYN next time she has a Pap smear in her general esthesia to have copper IUD placed.

## 2023-04-29 LAB — RPR: RPR Ser Ql: NONREACTIVE

## 2023-04-29 LAB — HIV ANTIBODY (ROUTINE TESTING W REFLEX): HIV Screen 4th Generation wRfx: NONREACTIVE

## 2023-04-30 ENCOUNTER — Other Ambulatory Visit

## 2023-05-01 LAB — URINE CYTOLOGY ANCILLARY ONLY
Chlamydia: NEGATIVE
Comment: NEGATIVE
Comment: NORMAL
Neisseria Gonorrhea: NEGATIVE

## 2023-05-15 ENCOUNTER — Ambulatory Visit: Admitting: Student

## 2023-05-19 ENCOUNTER — Ambulatory Visit: Admitting: Student

## 2023-05-19 VITALS — BP 116/70 | HR 71 | Ht 67.0 in

## 2023-05-19 DIAGNOSIS — A6004 Herpesviral vulvovaginitis: Secondary | ICD-10-CM | POA: Diagnosis not present

## 2023-05-19 DIAGNOSIS — N898 Other specified noninflammatory disorders of vagina: Secondary | ICD-10-CM | POA: Diagnosis not present

## 2023-05-19 MED ORDER — METRONIDAZOLE 0.75 % EX GEL: CUTANEOUS | 3 refills | Status: DC

## 2023-05-19 MED ORDER — ACYCLOVIR 400 MG PO TABS: 400.0000 mg | ORAL_TABLET | Freq: Three times a day (TID) | ORAL | 1 refills | Status: AC

## 2023-05-19 NOTE — Assessment & Plan Note (Signed)
 Patient now classifies as chronic BV will treat with metronidazole suppositories 2-3 times a week for the next 3 to 6 months.

## 2023-05-19 NOTE — Progress Notes (Signed)
    SUBJECTIVE:   CHIEF COMPLAINT / HPI:   Amy Herring is a 28 y.o. female presenting for irritated spot around the vagina and recurrent BV symptoms.   She reports she has had a lesion on her right labia majora ongoing for the last 3 weeks.  She reports it burns when she urinates.  It has not spread and always seems to pop up on the same spot.  She has had unprotected sexual intercourse 1 month ago.  She reports this always seems to pop up when she shaves.  She has tried exfoliating without significant relief.  BV: Has completed multiple courses of oral antibiotics without significant relief.  She continues to have vaginal discharge  PERTINENT  PMH / PSH: reviewed and updated.  OBJECTIVE:   BP 116/70   Pulse 71   Ht 5\' 7"  (1.702 m)   SpO2 99%   BMI 31.32 kg/m   Well-appearing, no acute distress Cardio: Regular rate, regular rhythm, no murmurs on exam. Pulm: Clear, no wheezing, no crackles. No increased work of breathing Abdominal: bowel sounds present, soft, non-tender, non-distended  Pelvic Exam: MA chaperone present  Normal external genitalia, multiple papules present on the right labia majora and a clustered pattern, no erythema, no crusting of the lesions, no drainage No abnormal discharge   ASSESSMENT/PLAN:   Assessment & Plan Herpes simplex vulvovaginitis Due to physical exam findings and HPI concern for possible HSV infection.  Discussed how to properly diagnose and treat.  Shared decision making with patient to either de-roof and swab the lesions or trial course of acyclovir.  Patient to trial acyclovir for the next 7 days.  If the lesions get better she more than likely has an HSV infection.  We can also confirm if lesions appear again and acyclovir continues to treat.  Visit is open-ended and if patient would like to have debriefing and swab completed we are more than happy to do so. Vaginal discharge Patient now classifies as chronic BV will treat with  metronidazole suppositories 2-3 times a week for the next 3 to 6 months.     Clem Currier, DO [facility]

## 2023-05-19 NOTE — Patient Instructions (Signed)
 It was great to see you today!   I have sent in the medications to your pharmacy. Keep in touch with how you are doing.   No future appointments.  Please arrive 15 minutes before your appointment to ensure smooth check in process.    Please call the clinic at [phone] if your symptoms worsen or you have any concerns.  Thank you for allowing me to participate in your care, Dr. Clem Currier [facility]

## 2023-05-21 ENCOUNTER — Telehealth: Payer: Self-pay | Admitting: Family Medicine

## 2023-05-21 NOTE — Telephone Encounter (Signed)
 After hours call  Patient contacted about metrogel prescription not being at her pharmacy. Advised this line was for emergencies; patient is understanding. Called pharmacy and left VM to call back about her Rx. Seems to be received by pharmacy on EPIC. Will forward to PCP to make them aware.

## 2023-05-25 ENCOUNTER — Encounter: Payer: Self-pay | Admitting: Student

## 2023-06-03 ENCOUNTER — Ambulatory Visit (HOSPITAL_COMMUNITY)
Admission: RE | Admit: 2023-06-03 | Discharge: 2023-06-03 | Disposition: A | Discharge status: Home / Self Care | Source: Ambulatory Visit | Attending: Family Medicine | Admitting: Family Medicine

## 2023-06-03 ENCOUNTER — Ambulatory Visit: Admitting: Family Medicine

## 2023-06-03 VITALS — BP 110/76 | HR 85 | Ht 67.0 in

## 2023-06-03 DIAGNOSIS — L739 Follicular disorder, unspecified: Secondary | ICD-10-CM | POA: Diagnosis not present

## 2023-06-03 DIAGNOSIS — S8991XA Unspecified injury of right lower leg, initial encounter: Secondary | ICD-10-CM | POA: Diagnosis not present

## 2023-06-03 DIAGNOSIS — L738 Other specified follicular disorders: Secondary | ICD-10-CM | POA: Diagnosis not present

## 2023-06-03 DIAGNOSIS — M25561 Pain in right knee: Secondary | ICD-10-CM | POA: Diagnosis not present

## 2023-06-03 MED ORDER — NAPROXEN 500 MG PO TABS: 500.0000 mg | ORAL_TABLET | Freq: Two times a day (BID) | ORAL | 1 refills | Status: AC

## 2023-06-03 NOTE — Patient Instructions (Signed)
 Good to see you today - Thank you for coming in  Things we discussed today:  1) Let's get an Xray of your right knee.  Please go to [facility] to get this done after your visit today.  You do not need to make an appointment. - If there is a fracture, I will reach out.  2) I am writing an order for crutches.  3) Take Naproxen  500mg  twice a day with meals for the next 2 weeks.  - You can also take tylenol  in addition to this for more pain control.   4) Make an appointment to come back on Friday or next Monday. We will re-examine the knee to see if you need procedures.

## 2023-06-03 NOTE — Progress Notes (Signed)
    SUBJECTIVE:   CHIEF COMPLAINT / HPI:   DH is a 28yo F w/ hx of AUB and IDA that pf acute R knee injury.  - Last night, playing volleyball. Fell on L knee and felt like she hyperextended her knee. Tried stand, but immediately had to sit down. They wrapped her up, put her in wheelchair and took her home.   - She tried icing and elevating it. - Unable to bend her knee past 90 degrees, is keeping it slightly bent while in wheelchair.   OBJECTIVE:   BP 110/76   Pulse 85   Ht 5\' 7"  (1.702 m)   LMP 03/06/2023   SpO2 98%   BMI 31.32 kg/m   General: Alert, pleasant woman. NAD. HEENT: NCAT. MMM. CV: RRR, no murmurs. Resp: CTAB, no wheezing or crackles. Normal WOB on RA.  Abm: Soft, nontender, nondistended. BS present. Ext: Diffusely swollen R knee, tender in anterior, anterolateral, anterosuperior.  Limited flexion and extension due to pain, knee is held in neutral/slightly flexed position. Skin: Warm, well perfused    ASSESSMENT/PLAN:   Assessment & Plan Injury of right knee, initial encounter C/f potential fracture, ligament sprain, muscle sprain. Will obtain knee XR to evaluate for fx. If fx noted, will refer to ortho. If not fx, will f/u Friday or Monday for repeat exam. - DME order for crutches - Naproxen  500 BID prn for pain - Tylenol  prn - Ice, elevation, weight bearing as tolerated.  - advised to avoid strenuous activity and volleyball for next 4 weeks. Work not provided.      Albin Huh, MD [facility]

## 2023-06-04 ENCOUNTER — Encounter: Payer: Self-pay | Admitting: Student

## 2023-06-04 DIAGNOSIS — S8991XA Unspecified injury of right lower leg, initial encounter: Secondary | ICD-10-CM

## 2023-06-05 MED ORDER — TIZANIDINE HCL 4 MG PO TABS: 4.0000 mg | ORAL_TABLET | Freq: Four times a day (QID) | ORAL | 0 refills | Status: DC | PRN

## 2023-06-05 NOTE — Addendum Note (Signed)
 Addended by: Albin Huh on: 06/05/2023 02:06 PM   Modules accepted: Orders

## 2023-06-07 ENCOUNTER — Encounter: Payer: Self-pay | Admitting: Family Medicine

## 2023-06-10 ENCOUNTER — Encounter: Payer: Self-pay | Admitting: Family Medicine

## 2023-06-16 ENCOUNTER — Ambulatory Visit (INDEPENDENT_AMBULATORY_CARE_PROVIDER_SITE_OTHER): Admitting: Family Medicine

## 2023-06-16 ENCOUNTER — Encounter: Payer: Self-pay | Admitting: Family Medicine

## 2023-06-16 VITALS — BP 126/78 | HR 76 | Ht 67.0 in

## 2023-06-16 DIAGNOSIS — S8991XA Unspecified injury of right lower leg, initial encounter: Secondary | ICD-10-CM

## 2023-06-16 DIAGNOSIS — S8991XD Unspecified injury of right lower leg, subsequent encounter: Secondary | ICD-10-CM

## 2023-06-16 MED ORDER — BACLOFEN 10 MG PO TABS: 5.0000 mg | ORAL_TABLET | Freq: Every day | ORAL | 0 refills | Status: AC | PRN

## 2023-06-16 NOTE — Patient Instructions (Signed)
 Good to see you today - Thank you for coming in  Things we discussed today:  1) Your knee exam is reassuring and the inflammation is much improved.  - Continue taking the naproxen , tylenol , and gabapentin -I am adding a few doses of a medication called baclofen.  You can take half to 1 full tablet a day as needed for muscle spasms and pain of the knee.  It can cause you to be drowsy. - You can stop taking tizanidine .  It is not known to cause yeast infections, but it is not necessary for you to keep taking it.  2) I recommend starting knee range-of-motion exercises and stretches for 10 minutes twice a day. You can start with just 5 minutes once a day and slowly work up.  - These are exercises can speed up healing and help maintain strength in your knee.  - Don't put yourself too hard.  If you feel any sharp pain in your knee, take a break and decrease the intensity of your exercise.  Come back to see me or your PCP in 4-6 weeks

## 2023-06-16 NOTE — Progress Notes (Signed)
    SUBJECTIVE:   CHIEF COMPLAINT / HPI:   DH is a 28yo F w hx of IDA that pf for R knee injury f/u - Has been ambulating with crutches - She reports Tizanidine  might have caused yeast infection symptoms. She has not been taking this. - Has been using tylenol , naproxen  BID, gabapentin.  - Pain is relatively controlled, but still ongoing.  - Able to bend her knee - Denies any crepitus   OBJECTIVE:   BP 126/78   Pulse 76   Ht 5\' 7"  (1.702 m)   LMP 06/15/2023   SpO2 99%   BMI 31.32 kg/m   General: Alert, pleasant woman. NAD. HEENT: NCAT. MMM. CV: RRR, no murmurs.   Resp: CTAB, no wheezing or crackles. Normal WOB on RA.  Abm: Soft, nontender, nondistended. BS present. Ext:  Tender to palpation of R antero-lateral knee (lateral to patella) Able to flex R knee 45degrees. No significant swelling of R knee. Skin: Warm, well perfused    ASSESSMENT/PLAN:   Assessment & Plan Injury of right knee, subsequent encounter Pain and mobility much improved. No instability or crepitus of knee joint.  - Add baclofen 5mg  daily prn for pain - Cont tylenol , naproxen , and gabapentin - Encouraged starting ROM exercises of knee. Consider adding PT if pt pain improves.  - F/u in 4-6 wk - Letter provided for work to allow longer bathroom breaks for next 4 weeks.      Albin Huh, MD [facility]

## 2023-06-18 ENCOUNTER — Encounter: Payer: Self-pay | Admitting: Family Medicine

## 2023-06-18 DIAGNOSIS — S8991XA Unspecified injury of right lower leg, initial encounter: Secondary | ICD-10-CM | POA: Insufficient documentation

## 2023-06-18 NOTE — Assessment & Plan Note (Signed)
 Pain and mobility much improved. No instability or crepitus of knee joint.  - Add baclofen 5mg  daily prn for pain - Cont tylenol , naproxen , and gabapentin - Encouraged starting ROM exercises of knee. Consider adding PT if pt pain improves.  - F/u in 4-6 wk - Letter provided for work to allow longer bathroom breaks for next 4 weeks.

## 2023-06-22 ENCOUNTER — Encounter: Payer: Self-pay | Admitting: Student

## 2023-06-22 NOTE — Telephone Encounter (Signed)
Message sent to provider who saw patient

## 2023-06-23 NOTE — Telephone Encounter (Signed)
 Patient Amy Herring on nurse line in regards to work accommodations note.   She reports is have been over 48 hours and no response.   Will forward to provider who saw patient.

## 2023-07-09 DIAGNOSIS — N942 Vaginismus: Secondary | ICD-10-CM | POA: Diagnosis not present

## 2023-07-21 ENCOUNTER — Other Ambulatory Visit (HOSPITAL_COMMUNITY): Payer: Self-pay

## 2023-07-21 MED ORDER — METRONIDAZOLE 0.75 % VA GEL
VAGINAL | 1 refills | Status: DC
  Filled 2023-07-21: qty 70, 84d supply, fill #0

## 2023-07-31 ENCOUNTER — Other Ambulatory Visit (HOSPITAL_COMMUNITY): Payer: Self-pay

## 2023-09-01 ENCOUNTER — Ambulatory Visit: Admitting: Student

## 2023-09-01 ENCOUNTER — Ambulatory Visit: Payer: Self-pay | Admitting: Student

## 2023-09-01 VITALS — BP 109/73 | HR 76 | Temp 97.9°F | Ht 67.0 in | Wt 206.0 lb

## 2023-09-01 DIAGNOSIS — J00 Acute nasopharyngitis [common cold]: Secondary | ICD-10-CM | POA: Diagnosis not present

## 2023-09-01 LAB — POC SOFIA SARS ANTIGEN FIA: SARS Coronavirus 2 Ag: NEGATIVE

## 2023-09-01 NOTE — Progress Notes (Signed)
    SUBJECTIVE:   CHIEF COMPLAINT / HPI:   Amy Herring is a 28 y.o. female  presenting for URI.   URI:  Symptoms started last night.  Medications tried at home none.  Tmax: no fever or chills at home  Sick contacts: recent travel to [place]  Signs of respiratory distress: no Appetite: mildly decreased, no N/V  Hydration: normal  PERTINENT  PMH / PSH: Reviewed and updated   OBJECTIVE:   BP 109/73   Pulse 76   Temp 97.9 F (36.6 C)   Ht 5' 7 (1.702 m)   Wt 206 lb (93.4 kg)   LMP  (LMP Unknown) Comment: Sometime in Feb.  SpO2 98%   BMI 32.26 kg/m   Well-appearing, no acute distress HEENT: erythematous nasal turbinates, clear TMs bilaterally, mild cervical lymphadenopathy bilaterally. Mild maxillary sinus tenderness Cardio: Regular rate, regular rhythm, no murmurs on exam. <2 sec capillary refill  Pulm: Clear, no wheezing, no crackles. No increased work of breathing Abdominal: bowel sounds present, soft, non-tender, non-distended  ASSESSMENT/PLAN:   URI:  No signs of respiratory distress on exam. Patient appears well hydrated.  Most likely viral mediated, supportive therapy indicated with return precautions for trouble breathing or persistent fever.  POC tested preformed COVID neg.   Damien Pinal, DO [facility]

## 2023-09-01 NOTE — Patient Instructions (Signed)
 Let me know if you begin to experience any neurological symptoms. You most likely have a virus from traveling.   We will test you for flu and covid today. I will send you the results via mychart.   No future appointments.  Please arrive 15 minutes before your appointment to ensure smooth check in process.    Please call the clinic at [phone] if your symptoms worsen or you have any concerns.  Thank you for allowing me to participate in your care, Dr. Damien Pinal [facility]

## 2023-09-02 ENCOUNTER — Encounter: Payer: Self-pay | Admitting: Student

## 2023-09-15 ENCOUNTER — Ambulatory Visit (INDEPENDENT_AMBULATORY_CARE_PROVIDER_SITE_OTHER): Admitting: Family Medicine

## 2023-09-15 VITALS — BP 100/60 | HR 80

## 2023-09-15 DIAGNOSIS — B9689 Other specified bacterial agents as the cause of diseases classified elsewhere: Secondary | ICD-10-CM | POA: Diagnosis not present

## 2023-09-15 DIAGNOSIS — N760 Acute vaginitis: Secondary | ICD-10-CM

## 2023-09-15 DIAGNOSIS — R4589 Other symptoms and signs involving emotional state: Secondary | ICD-10-CM | POA: Diagnosis not present

## 2023-09-15 MED ORDER — METRONIDAZOLE 0.75 % VA GEL
1.0000 | Freq: Every day | VAGINAL | 0 refills | Status: DC
Start: 2023-09-15 — End: 2023-09-15

## 2023-09-15 MED ORDER — METRONIDAZOLE 0.75 % VA GEL: 1.0000 | Freq: Every day | VAGINAL | 0 refills | Status: AC

## 2023-09-15 NOTE — Patient Instructions (Addendum)
 1) For your BV symptoms, apply metrogel  once a day for 5 days. - I recommend going back to your OBGYN if you keep getting recurrent BV infections.  2) For your support animal, I recommend getting connected with a therapist to establish care. They can help assess you for ESA paperwork, but also provide additional support for your mood symptoms.   For information on therapists, please go to [email or web address]. You can also contact your insurance company to find an Hospital doctor.

## 2023-09-15 NOTE — Progress Notes (Signed)
    SUBJECTIVE:   Chief compliant/HPI: annual examination  Amy Herring is a 28 y.o. who presents today for an annual exam.   - No new medications - Reports chronic BV, and requests a refill of her metronidazole .  - Goes to [facility] for pap smears (last done in April 2025)  - Not currently sexually active. Not on any contraception. Pt is not interested in contraception at this time.  - Supplements - probiotics - also gets monthly iron transfusion  - reports desire for emotional support animal for her depressed mood. Reports depressed mood since her other dog passed a few years ago and feels like a support animal would be helpful for her symptoms.  - Denies SI - Is open to therapy/counseling  Updated history tabs and problem list.   OBJECTIVE:   BP 100/60   Pulse 80   LMP  (LMP Unknown) Comment: Sometime in Feb.  SpO2 95%   General: Alert, pleasant woman. NAD. HEENT: NCAT. MMM. CV: RRR, no murmurs.   Resp: CTAB, no wheezing or crackles. Normal WOB on RA.   Ext: Moves all ext spontaneously Skin: Warm, well perfused   ASSESSMENT/PLAN:   Assessment & Plan BV (bacterial vaginosis) Has hx of chronic BV, for which she is on vaginal metrogel . Will provide refill for Metrogel , but also encouraged to f/u w/ her OBGYN regarding her chronic symptoms.  Depressed mood Plan to establish care with therapist.  Annual Examination  See AVS for age appropriate recommendations.    Blood pressure reviewed and at goal.   The patient currently uses abstinence for contraception. Folate recommended as appropriate, minimum of 400 mcg per day.     Cervical cancer screening: done at [facility], as above  MyChart Activation:Already signed up   Follow up in 1  year or sooner if indicated.    Twyla Nearing, MD [facility]

## 2023-11-12 ENCOUNTER — Encounter: Payer: Self-pay | Admitting: Podiatry

## 2023-11-12 ENCOUNTER — Ambulatory Visit: Admitting: Podiatry

## 2023-11-12 DIAGNOSIS — M7661 Achilles tendinitis, right leg: Secondary | ICD-10-CM | POA: Diagnosis not present

## 2023-11-12 DIAGNOSIS — M7662 Achilles tendinitis, left leg: Secondary | ICD-10-CM | POA: Diagnosis not present

## 2023-11-12 NOTE — Progress Notes (Signed)
 Subjective:   Patient ID: Amy Herring, female   DOB: 28 y.o.   MRN: [id number]   HPI Patient states that she has been somewhat improved but she knows she needs new orthotics as hers are several years old and no longer providing proper support   ROS      Objective:  Physical Exam  Neurovascular status intact with patient found to have significant history of Achilles tendinitis bilateral with mild symptoms but not anywhere near as severe as before orthotics were started     Assessment:  Chronic Achilles tendinitis bilateral     Plan:  At this point I have recommended orthotics to lift up the heels and explained to the patient the utilization of orthotic devices.  Patient is casted for functional orthotic after evaluation by pedorthist who agrees with assessment and will be seen back when orthotics returned

## 2023-11-23 DIAGNOSIS — F411 Generalized anxiety disorder: Secondary | ICD-10-CM | POA: Diagnosis not present

## 2023-11-25 ENCOUNTER — Encounter: Payer: Self-pay | Admitting: Student

## 2023-11-26 ENCOUNTER — Telehealth (INDEPENDENT_AMBULATORY_CARE_PROVIDER_SITE_OTHER): Admitting: Student

## 2023-11-26 DIAGNOSIS — N760 Acute vaginitis: Secondary | ICD-10-CM | POA: Diagnosis not present

## 2023-11-26 DIAGNOSIS — F419 Anxiety disorder, unspecified: Secondary | ICD-10-CM

## 2023-11-26 DIAGNOSIS — B9689 Other specified bacterial agents as the cause of diseases classified elsewhere: Secondary | ICD-10-CM | POA: Diagnosis not present

## 2023-11-26 MED ORDER — FLUCONAZOLE 150 MG PO TABS: 150.0000 mg | ORAL_TABLET | Freq: Once | ORAL | 0 refills | Status: AC

## 2023-11-26 MED ORDER — BORIC ACID VAGINAL 600 MG VA SUPP: 600.0000 mg | Freq: Every day | VAGINAL | 0 refills | Status: AC

## 2023-11-26 MED ORDER — TINIDAZOLE 500 MG PO TABS: 2.0000 g | ORAL_TABLET | Freq: Once | ORAL | 0 refills | Status: AC

## 2023-11-26 NOTE — Progress Notes (Signed)
 [facility] Telemedicine Visit  Patient consented to have virtual visit and was identified by name and date of birth. Method of visit: Video  Encounter participants: Patient: Amy Herring - located at work Provider: Damien Pinal - located at [facility]  Chief Complaint: anxiety/depression   HPI:  Anxiety and depressive symptoms - Anxiety and depression exacerbated by the loss of her previous dog - Mood symptoms improve with the presence of a dog; considering an emotional support animal - Currently engaged in therapy - Open to pharmacologic treatment but expresses concern about potential interference with iron transfusions  Recurrent bacterial vaginosis - Recurrent episodes of bacterial vaginosis - Currently using metronidazole  gel with dissatisfaction regarding efficacy - Interested in oral medication options and in trying boric acid  ROS: per HPI  Pertinent PMHx: chronic BV   Exam:  There were no vitals taken for this visit.  Respiratory: speaking in clear sentences, no respiratory distress   Assessment & Plan Anxiety Awaiting formal diagnosis from therapist, considering generalized anxiety disorder or bipolar disorder. - Provide ESA letter for one dog. - Await formal diagnosis from therapist before starting medication. - Discuss potential medication options once diagnosis is confirmed. BV (bacterial vaginosis) Chronic recurrent BV. Prefers oral medication. Discussed risks of systemic antibiotics. No partner to treat for BV. - Prescribe tenidazole 2g as a one-time dose. - Prescribe fluconazole  for potential yeast infection post-antibiotic. - Prescribe boric acid suppositories for use two weeks after antibiotic course. - Continue metronidazole  gel 1-2 times per week as needed.    Time spent during visit with patient: 20 minutes

## 2023-11-30 DIAGNOSIS — F411 Generalized anxiety disorder: Secondary | ICD-10-CM | POA: Diagnosis not present

## 2023-12-07 DIAGNOSIS — F411 Generalized anxiety disorder: Secondary | ICD-10-CM | POA: Diagnosis not present

## 2023-12-10 ENCOUNTER — Encounter: Payer: Self-pay | Admitting: Student

## 2023-12-14 DIAGNOSIS — F411 Generalized anxiety disorder: Secondary | ICD-10-CM | POA: Diagnosis not present

## 2023-12-22 DIAGNOSIS — F411 Generalized anxiety disorder: Secondary | ICD-10-CM | POA: Diagnosis not present

## 2023-12-24 ENCOUNTER — Telehealth: Payer: Self-pay

## 2023-12-24 NOTE — Telephone Encounter (Signed)
 Called patient to schedule PUO appointment. Patient will call back tomorrow to schedule the appointment.

## 2024-01-05 ENCOUNTER — Ambulatory Visit: Admitting: Student

## 2024-01-07 ENCOUNTER — Telehealth: Admitting: Student

## 2024-01-07 DIAGNOSIS — N760 Acute vaginitis: Secondary | ICD-10-CM

## 2024-01-07 DIAGNOSIS — B9689 Other specified bacterial agents as the cause of diseases classified elsewhere: Secondary | ICD-10-CM | POA: Diagnosis not present

## 2024-01-07 DIAGNOSIS — F419 Anxiety disorder, unspecified: Secondary | ICD-10-CM | POA: Diagnosis not present

## 2024-01-07 MED ORDER — METRONIDAZOLE 0.75 % EX GEL: CUTANEOUS | 3 refills | Status: AC

## 2024-01-07 MED ORDER — FLUCONAZOLE 150 MG PO TABS: 150.0000 mg | ORAL_TABLET | Freq: Once | ORAL | 0 refills | Status: AC

## 2024-01-07 NOTE — Assessment & Plan Note (Signed)
 FMLA paperwork filled out and placed in to be faxed pile.

## 2024-01-07 NOTE — Assessment & Plan Note (Addendum)
 Chronic BV, no new partners  Refilled metronidazole  gel  Prescribed one time dose of diflucan  for yeast infection

## 2024-01-07 NOTE — Progress Notes (Signed)
 [facility] Telemedicine Visit  Patient consented to have virtual visit and was identified by name and date of birth. Method of visit: Video  Encounter participants: Patient: Amy Herring - located at work  Provider: Damien Pinal - located at [facility]  Chief Complaint: Anxious mood, vaginal discharge   HPI:  Patient would like intermittent leave for her FMLA filled out. She will need this for therapy appointments and potential leaves of absence in the setting of her high stress job.   Continues to have chronic BV, requesting refill of metronidazole  gel and diflucan  tablet.   ROS: per HPI  Pertinent PMHx: as above   Exam:  There were no vitals taken for this visit.  Respiratory: no increased WOB, answers in complete sentences.    Assessment & Plan Anxious mood FMLA paperwork filled out and placed in to be faxed pile.  BV (bacterial vaginosis) Chronic BV, no new partners  Refilled metronidazole  gel  Prescribed one time dose of diflucan  for yeast infection     Time spent during visit with patient: 20 minutes

## 2024-01-11 ENCOUNTER — Other Ambulatory Visit (HOSPITAL_COMMUNITY): Payer: Self-pay | Admitting: Nurse Practitioner

## 2024-01-11 ENCOUNTER — Ambulatory Visit (HOSPITAL_COMMUNITY)
Admission: RE | Admit: 2024-01-11 | Discharge: 2024-01-11 | Disposition: A | Payer: Self-pay | Source: Ambulatory Visit | Attending: Nurse Practitioner

## 2024-01-11 ENCOUNTER — Other Ambulatory Visit (HOSPITAL_COMMUNITY): Payer: Self-pay

## 2024-01-11 DIAGNOSIS — Z026 Encounter for examination for insurance purposes: Secondary | ICD-10-CM

## 2024-01-11 DIAGNOSIS — Z0189 Encounter for other specified special examinations: Secondary | ICD-10-CM | POA: Diagnosis not present

## 2024-01-11 MED ORDER — MELOXICAM 7.5 MG PO TABS
7.5000 mg | ORAL_TABLET | Freq: Every day | ORAL | 0 refills | Status: AC | PRN
  Filled 2024-01-11: qty 14, 14d supply, fill #0

## 2024-01-11 MED ORDER — CYCLOBENZAPRINE HCL 5 MG PO TABS: 5.0000 mg | ORAL_TABLET | Freq: Every evening | ORAL | 0 refills | Status: AC | PRN

## 2024-01-11 MED ORDER — CYCLOBENZAPRINE HCL 5 MG PO TABS
5.0000 mg | ORAL_TABLET | Freq: Every evening | ORAL | 0 refills | Status: AC | PRN
  Filled 2024-01-11: qty 14, 14d supply, fill #0

## 2024-01-12 ENCOUNTER — Other Ambulatory Visit (HOSPITAL_COMMUNITY): Payer: Self-pay

## 2024-01-12 NOTE — Telephone Encounter (Signed)
 Called patient to schedule an appointment to PUO. LVM balance is $84.18. Also said she can come in and pick up without an appointment if that is what she wanted. Balance is due at pick up.

## 2024-01-25 ENCOUNTER — Other Ambulatory Visit: Payer: Self-pay

## 2024-01-25 ENCOUNTER — Ambulatory Visit

## 2024-01-25 DIAGNOSIS — M25612 Stiffness of left shoulder, not elsewhere classified: Secondary | ICD-10-CM | POA: Diagnosis present

## 2024-01-25 DIAGNOSIS — R531 Weakness: Secondary | ICD-10-CM | POA: Insufficient documentation

## 2024-01-25 DIAGNOSIS — M25512 Pain in left shoulder: Secondary | ICD-10-CM | POA: Insufficient documentation

## 2024-01-25 NOTE — Therapy (Signed)
 " OUTPATIENT PHYSICAL THERAPY UPPER EXTREMITY EVALUATION   Patient Name: Amy Herring MRN: [id number] DOB:1995/12/24, 28 y.o., female Today's Date: 01/25/2024  END OF SESSION:  PT End of Session - 01/25/24 0947     Visit Number 1    Number of Visits 16    Date for Recertification  03/27/24    Authorization Type [id number]    PT Start Time 0800    PT Stop Time 0840    PT Time Calculation (min) 40 min          Past Medical History:  Diagnosis Date   Iron deficiency anemia    Recurrent major depression    Past Surgical History:  Procedure Laterality Date   SIGMOIDOSCOPY  11/26/2015   Patient Active Problem List   Diagnosis Date Noted   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   Injury of right knee 06/18/2023   BV (bacterial vaginosis) 01/09/2023   Migraine 01/09/2023   Mitral valve disorder 05/08/2022   Iron deficiency anemia due to chronic blood loss 04/06/2020   Abnormal uterine bleeding (AUB) 10/17/2019    PCP: Cleotilde Perkins, DO PCP - General   REFERRING PROVIDER: Dozier Soulier, MD Ref Provider   REFERRING DIAG: [phone] (ICD-10-CM) - Strain of muscle(s) and tendon(s) of the rotator cuff of left shoulder, initial encounter   THERAPY DIAG:  Acute pain of left shoulder  Decreased range of motion of left shoulder  Generalized weakness  Rationale for Evaluation and Treatment: rehabilitation  ONSET DATE: December 4th   SUBJECTIVE:                                                                                                                                                                                      SUBJECTIVE STATEMENT: Was handing patient and felt instant pain in shoulder. 01/07/24 was DOI and has had burning feeling in shoulder ever since.  Hand dominance: Ambidextrous  PERTINENT HISTORY: Mitral valve disorder (echocardiogram performed last year). Plays volleyball and would like to get back to doing so.  PAIN:  9W, 6C Are you having pain? Yes: NPRS  scale: 10 Pain location: left shoulder Pain description: burning,  Aggravating factors: movement in general  Relieving factors: cortisone shot, muscle relaxer  PRECAUTIONS: None  RED FLAGS: None   WEIGHT BEARING RESTRICTIONS: No  FALLS:  Has patient fallen in last 6 months? No  OCCUPATION: Works in research scientist (physical sciences) health  PLOF: Independent  PATIENT GOALS: Full ROM for swinging in volleyball, able to dress.    OBJECTIVE:  Note: Objective measures were completed at Evaluation unless otherwise noted.   PATIENT SURVEYS :  Quick Dash:  QUICK DASH  Please rate your ability do the  following activities in the last week by selecting the number below the appropriate response.   Activities Rating  Open a tight or new jar.    Do heavy household chores (e.g., wash walls, floors).   Carry a shopping bag or briefcase   Wash your back.   Use a knife to cut food.   Recreational activities in which you take some force or impact through your arm, shoulder or hand (e.g., golf, hammering, tennis, etc.).   During the past week, to what extent has your arm, shoulder or hand problem interfered with your normal social activities with family, friends, neighbors or groups?    During the past week, were you limited in your work or other regular daily activities as a result of your arm, shoulder or hand problem?   Rate the severity of the following symptoms in the last week: Arm, Shoulder, or hand pain.   Rate the severity of the following symptoms in the last week: Tingling (pins and needles) in your arm, shoulder or hand.   During the past week, how much difficulty have you had sleeping because of the pain in your arm, shoulder or hand?     (A QuickDASH score may not be calculated if there is greater than 1 missing item.)  Quick Dash Disability/Symptom Score: [(sum of  (n) responses/ (n)] x 25 = 33  Minimally Clinically Important Difference (MCID): 15-20 points  (Franchignoni, F. et al. (2013).  Minimally clinically important difference of the disabilities of the arm, shoulder, and hand outcome measures (DASH) and its shortened version (Quick DASH). Journal of Orthopaedic & Sports Physical Therapy, 44(1), 30-39)   COGNITION: Overall cognitive status: Within functional limits for tasks assessed     SENSATION: WFL  POSTURE: rounded shoulders and forward head  UPPER EXTREMITY ROM:   Active ROM Right eval Left eval  Shoulder flexion wfl 105!  Shoulder extension    Shoulder abduction wfl 90!  Shoulder adduction    Shoulder internal rotation wfl wfl  Shoulder external rotation wfl unable  Elbow flexion    Elbow extension    Wrist flexion    Wrist extension    Wrist ulnar deviation    Wrist radial deviation    Wrist pronation    Wrist supination    (Blank rows = not tested)  UPPER EXTREMITY MMT:  MMT Right eval Left eval  Shoulder flexion 4- 3+!  Shoulder extension    Shoulder abduction 4- 3+!  Shoulder adduction    Shoulder internal rotation 4 4  Shoulder external rotation 4 4-!  Middle trapezius    Lower trapezius    Elbow flexion    Elbow extension    Wrist flexion    Wrist extension    Wrist ulnar deviation    Wrist radial deviation    Wrist pronation    Wrist supination    Grip strength (lbs)    (Blank rows = not tested)  SHOULDER SPECIAL TESTS:  Rotator cuff assessment: Empty can test: positive  and Infraspinatus test: positive    JOINT MOBILITY TESTING:  N/a  PALPATION:  TTP L infraspinatus/teres minor/latissimus  TREATMENT DATE:  TREATMENT 01/25/2024:    Neuromuscular re-ed: YTB row x6x3s YTB ER x6x3s    Self-care/Home Management: Patient educated on HEP, POC, prognosis, and relevant tissues/anatomy.     PATIENT EDUCATION: Education details: HEP Person educated: Patient Education method: Advertising Account Executive, Actor cues, Verbal cues, and Handouts Education comprehension: verbalized understanding and returned demonstration  HOME EXERCISE PROGRAM: 5x/wk, 2x/day, 2x6x3s YTB row x6x3s YTB ER x6x3s  ASSESSMENT:  CLINICAL IMPRESSION: EVAL: Patient is a 28 year old female who presents with acute L shoulder pain after handing a patient on 01/07/24 w/ suspected acute RTC strain. Patient presents with deficits in: L shoulder ROM, strength, and excessive pain. As a result, the patient would benefit from skilled PT to address aforementioned deficits via plan below.    OBJECTIVE IMPAIRMENTS: decreased ROM, decreased strength, impaired UE functional use, and pain.   ACTIVITY LIMITATIONS: carrying, lifting, and reach over head  PERSONAL FACTORS: Time since onset of injury/illness/exacerbation are also affecting patient's functional outcome.   REHAB POTENTIAL: Fair    CLINICAL DECISION MAKING: Stable/uncomplicated  EVALUATION COMPLEXITY: Moderate  GOALS: Goals reviewed with patient? No  SHORT TERM GOALS: Target date: 02/15/2024    1) Patient will demonstrate 75% HEP compliance to show independence with self-management of condition   Baseline: 0% Goal status: INITIAL  2) Patient will decrease worst pain to 7 at most to improve ADL completion and overall QOL   Baseline: 9 Goal status: INITIAL    LONG TERM GOALS: Target date: 03/27/24   1) Patient will demonstrate 100% HEP compliance to show independence with self-management of condition   Baseline: 0% Goal status: INITIAL  2) Patient will decrease worst pain to 5 at most to improve ADL completion and overall QOL   Baseline: 9 Goal status: INITIAL  3) Patient will demonstrate a 5 point improvement in quickdash to show improvements in ADL completion and overall QOL    Baseline: 33 Goal status: INITIAL  4) Patient will have at least [facility] ROM of L shoulder without significant increases in pain to demonstrate  improved joint mobility needed for ADL completion    Baseline: see chart Goal status: INITIAL     PLAN: PT FREQUENCY: 1-2x/week  PT DURATION: 8 weeks  PLANNED INTERVENTIONS: 97110-Therapeutic exercises, 97530- Therapeutic activity, 97112- Neuromuscular re-education, 97535- Self Care, [id number]- Manual therapy, and Patient/Family education  PLAN FOR NEXT SESSION: HEP assessment and progression, symptom modulation, and loading (isolated and/or functional). Manual therapy and NME training as needed. L RTC sx modulation via tolerated exercises.     Washington Greener Charita Lindenberger  PT, DPT  01/25/2024, 10:06 AM  "

## 2024-02-08 ENCOUNTER — Ambulatory Visit: Attending: Orthopedic Surgery

## 2024-02-08 DIAGNOSIS — M25612 Stiffness of left shoulder, not elsewhere classified: Secondary | ICD-10-CM | POA: Insufficient documentation

## 2024-02-08 DIAGNOSIS — M25512 Pain in left shoulder: Secondary | ICD-10-CM | POA: Insufficient documentation

## 2024-02-08 DIAGNOSIS — R531 Weakness: Secondary | ICD-10-CM | POA: Insufficient documentation

## 2024-02-08 NOTE — Therapy (Signed)
 " OUTPATIENT PHYSICAL THERAPY TREATMENT   Patient Name: Amy Herring MRN: [id number] DOB:1995/09/06, 29 y.o., female Today's Date: 02/08/2024  END OF SESSION:  PT End of Session - 02/08/24 0804     Visit Number 2    Number of Visits 16    Date for Recertification  03/27/24    Authorization Type [employer]    PT Start Time 0804    PT Stop Time 0842    PT Time Calculation (min) 38 min           Past Medical History:  Diagnosis Date   Iron deficiency anemia    Recurrent major depression    Past Surgical History:  Procedure Laterality Date   SIGMOIDOSCOPY  11/26/2015   Patient Active Problem List   Diagnosis Date Noted   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   Injury of right knee 06/18/2023   BV (bacterial vaginosis) 01/09/2023   Migraine 01/09/2023   Mitral valve disorder 05/08/2022   Iron deficiency anemia due to chronic blood loss 04/06/2020   Abnormal uterine bleeding (AUB) 10/17/2019    PCP: Cleotilde Perkins, DO PCP - General   REFERRING PROVIDER: Dozier Soulier, MD Ref Provider   REFERRING DIAG: [phone] (ICD-10-CM) - Strain of muscle(s) and tendon(s) of the rotator cuff of left shoulder, initial encounter   THERAPY DIAG:  Acute pain of left shoulder  Decreased range of motion of left shoulder  Generalized weakness  Rationale for Evaluation and Treatment: rehabilitation  ONSET DATE: December 4th   SUBJECTIVE:                                                                                                                                                                                      SUBJECTIVE STATEMENT: Pt presents to PT with 5/10 L shoulder pain. Has been compliant with HEP.  Hand dominance: Ambidextrous  PERTINENT HISTORY: Mitral valve disorder (echocardiogram performed last year). Plays volleyball and would like to get back to doing so.  PAIN:  9W, 6C Are you having pain?  Yes: NPRS scale: 5/10 Pain location: left shoulder Pain description:  burning,  Aggravating factors: movement in general  Relieving factors: cortisone shot, muscle relaxer  PRECAUTIONS: None  RED FLAGS: None   WEIGHT BEARING RESTRICTIONS: No  FALLS:  Has patient fallen in last 6 months? No  OCCUPATION: Works in research scientist (physical sciences) health  PLOF: Independent  PATIENT GOALS: Full ROM for swinging in volleyball, able to dress.    OBJECTIVE:  Note: Objective measures were completed at Evaluation unless otherwise noted.   PATIENT SURVEYS :  Quick Dash:  QUICK DASH  Please rate your ability do the following activities in the last week by  selecting the number below the appropriate response.   Activities Rating  Open a tight or new jar.    Do heavy household chores (e.g., wash walls, floors).   Carry a shopping bag or briefcase   Wash your back.   Use a knife to cut food.   Recreational activities in which you take some force or impact through your arm, shoulder or hand (e.g., golf, hammering, tennis, etc.).   During the past week, to what extent has your arm, shoulder or hand problem interfered with your normal social activities with family, friends, neighbors or groups?    During the past week, were you limited in your work or other regular daily activities as a result of your arm, shoulder or hand problem?   Rate the severity of the following symptoms in the last week: Arm, Shoulder, or hand pain.   Rate the severity of the following symptoms in the last week: Tingling (pins and needles) in your arm, shoulder or hand.   During the past week, how much difficulty have you had sleeping because of the pain in your arm, shoulder or hand?     (A QuickDASH score may not be calculated if there is greater than 1 missing item.)  Quick Dash Disability/Symptom Score: [(sum of  (n) responses/ (n)] x 25 = 33  Minimally Clinically Important Difference (MCID): 15-20 points  (Franchignoni, F. et al. (2013). Minimally clinically important difference of the  disabilities of the arm, shoulder, and hand outcome measures (DASH) and its shortened version (Quick DASH). Journal of Orthopaedic & Sports Physical Therapy, 44(1), 30-39)   COGNITION: Overall cognitive status: Within functional limits for tasks assessed     SENSATION: WFL  POSTURE: rounded shoulders and forward head  UPPER EXTREMITY ROM:   Active ROM Right eval Left eval  Shoulder flexion wfl 105!  Shoulder extension    Shoulder abduction wfl 90!  Shoulder adduction    Shoulder internal rotation wfl wfl  Shoulder external rotation wfl unable  Elbow flexion    Elbow extension    Wrist flexion    Wrist extension    Wrist ulnar deviation    Wrist radial deviation    Wrist pronation    Wrist supination    (Blank rows = not tested)  UPPER EXTREMITY MMT:  MMT Right eval Left eval  Shoulder flexion 4- 3+!  Shoulder extension    Shoulder abduction 4- 3+!  Shoulder adduction    Shoulder internal rotation 4 4  Shoulder external rotation 4 4-!  Middle trapezius    Lower trapezius    Elbow flexion    Elbow extension    Wrist flexion    Wrist extension    Wrist ulnar deviation    Wrist radial deviation    Wrist pronation    Wrist supination    Grip strength (lbs)    (Blank rows = not tested)  SHOULDER SPECIAL TESTS:  Rotator cuff assessment: Empty can test: positive  and Infraspinatus test: positive    JOINT MOBILITY TESTING:  N/a  PALPATION:  TTP L infraspinatus/teres minor/latissimus  TREATMENT DATE:  TREATMENT 02/08/2024: Therapeutic Activity: UBE lvl 1.0 x 2 min FM row 3x8 13# L shoulder ER/IR 2x10 YTB Supine horizontal abd 2x10 GTB S/L shoulder ER 2x12 L Seated bilateral ER 2x10 YTB Seated low row 3x8 25#   TREATMENT 01/25/2024: Neuromuscular re-ed: YTB row x6x3s YTB ER x6x3s Self-care/Home Management: Patient educated  on HEP, POC, prognosis, and relevant tissues/anatomy  PATIENT EDUCATION: Education details: HEP Person educated: Patient Education method: Programmer, Multimedia, Facilities Manager, Actor cues, Verbal cues, and Handouts Education comprehension: verbalized understanding and returned demonstration  HOME EXERCISE PROGRAM: 5x/wk, 2x/day, 2x6x3s YTB row x6x3s YTB ER x6x3s  Access Code: [email or web address] URL: [email or web address] Date: 02/08/2024 Prepared by: Alm Kingdom  Exercises - Supine Shoulder Horizontal Abduction with Resistance  - 1 x daily - 7 x weekly - 2-3 sets - 10 reps - green band hold - Sidelying Shoulder External Rotation  - 1 x daily - 7 x weekly - 2-3 sets - 12 reps  ASSESSMENT:  CLINICAL IMPRESSION: Pt was able to complete all prescribed exercises with no adverse effect. Therapy today focused on improving dynamic stability and infraspinatus strength of L shoulder in order to improve functional ability and decrease pain. HEP was updated for continued progression. She continues to benefit from skilled PT services, will progress as tolerated.   EVAL: Patient is a 29 year old female who presents with acute L shoulder pain after handing a patient on 01/07/24 w/ suspected acute RTC strain. Patient presents with deficits in: L shoulder ROM, strength, and excessive pain. As a result, the patient would benefit from skilled PT to address aforementioned deficits via plan below.    OBJECTIVE IMPAIRMENTS: decreased ROM, decreased strength, impaired UE functional use, and pain.   ACTIVITY LIMITATIONS: carrying, lifting, and reach over head  PERSONAL FACTORS: Time since onset of injury/illness/exacerbation are also affecting patient's functional outcome.   REHAB POTENTIAL: Fair    CLINICAL DECISION MAKING: Stable/uncomplicated  EVALUATION COMPLEXITY: Moderate  GOALS: Goals reviewed with patient? No  SHORT TERM GOALS: Target date: 02/15/2024    1) Patient will demonstrate 75%  HEP compliance to show independence with self-management of condition   Baseline: 0% Goal status: INITIAL  2) Patient will decrease worst pain to 7 at most to improve ADL completion and overall QOL   Baseline: 9 Goal status: INITIAL    LONG TERM GOALS: Target date: 03/27/24   1) Patient will demonstrate 100% HEP compliance to show independence with self-management of condition   Baseline: 0% Goal status: INITIAL  2) Patient will decrease worst pain to 5 at most to improve ADL completion and overall QOL   Baseline: 9 Goal status: INITIAL  3) Patient will demonstrate a 5 point improvement in quickdash to show improvements in ADL completion and overall QOL    Baseline: 33 Goal status: INITIAL  4) Patient will have at least [facility] ROM of L shoulder without significant increases in pain to demonstrate improved joint mobility needed for ADL completion    Baseline: see chart Goal status: INITIAL     PLAN: PT FREQUENCY: 1-2x/week  PT DURATION: 8 weeks  PLANNED INTERVENTIONS: 97110-Therapeutic exercises, 97530- Therapeutic activity, 97112- Neuromuscular re-education, 97535- Self Care, 02859- Manual therapy, and Patient/Family education  PLAN FOR NEXT SESSION: HEP assessment and progression, symptom modulation, and loading (isolated and/or functional). Manual therapy and NME training as needed. L RTC sx modulation via tolerated exercises.     Alm JAYSON Kingdom PT  02/08/2024 8:43 AM   "

## 2024-02-15 ENCOUNTER — Ambulatory Visit

## 2024-02-16 ENCOUNTER — Ambulatory Visit: Admitting: Physical Therapy

## 2024-02-22 ENCOUNTER — Encounter: Payer: Self-pay | Admitting: Physical Therapy

## 2024-02-22 ENCOUNTER — Ambulatory Visit: Attending: Orthopedic Surgery | Admitting: Physical Therapy

## 2024-02-22 DIAGNOSIS — R531 Weakness: Secondary | ICD-10-CM | POA: Insufficient documentation

## 2024-02-22 DIAGNOSIS — M25612 Stiffness of left shoulder, not elsewhere classified: Secondary | ICD-10-CM | POA: Insufficient documentation

## 2024-02-22 DIAGNOSIS — M25512 Pain in left shoulder: Secondary | ICD-10-CM | POA: Diagnosis present

## 2024-02-22 NOTE — Therapy (Signed)
 " OUTPATIENT PHYSICAL THERAPY TREATMENT   Patient Name: Amy Herring MRN: [id number] DOB:[birth year], 29 y.o., female Today's Date: 02/22/2024  END OF SESSION:  PT End of Session - 02/22/24 0807     Visit Number 3    Number of Visits 16    Date for Recertification  03/27/24    Authorization Type [employer]    PT Start Time 0805    PT Stop Time 0843    PT Time Calculation (min) 38 min           Past Medical History:  Diagnosis Date   Iron deficiency anemia    Recurrent major depression    Past Surgical History:  Procedure Laterality Date   SIGMOIDOSCOPY  11/26/2015   Patient Active Problem List   Diagnosis Date Noted   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   Injury of right knee 06/18/2023   BV (bacterial vaginosis) 01/09/2023   Migraine 01/09/2023   Mitral valve disorder 05/08/2022   Iron deficiency anemia due to chronic blood loss 04/06/2020   Abnormal uterine bleeding (AUB) 10/17/2019    PCP: Cleotilde Perkins, DO PCP - General   REFERRING PROVIDER: Dozier Soulier, MD Ref Provider   REFERRING DIAG: [phone] (ICD-10-CM) - Strain of muscle(s) and tendon(s) of the rotator cuff of left shoulder, initial encounter   THERAPY DIAG:  Acute pain of left shoulder  Decreased range of motion of left shoulder  Generalized weakness  Rationale for Evaluation and Treatment: rehabilitation  ONSET DATE: December 4th   SUBJECTIVE:                                                                                                                                                                                      SUBJECTIVE STATEMENT: Pt presents to PT with 5/10 L shoulder pain. Has been compliant with HEP.  Hand dominance: Ambidextrous  PERTINENT HISTORY: Mitral valve disorder (echocardiogram performed last year). Plays volleyball and would like to get back to doing so.  PAIN:  9W, 6C Are you having pain?  Yes: NPRS scale: 5/10 Pain location: left shoulder Pain description:  burning,  Aggravating factors: movement in general  Relieving factors: cortisone shot, muscle relaxer  PRECAUTIONS: None  RED FLAGS: None   WEIGHT BEARING RESTRICTIONS: No  FALLS:  Has patient fallen in last 6 months? No  OCCUPATION: Works in research scientist (physical sciences) health  PLOF: Independent  PATIENT GOALS: Full ROM for swinging in volleyball, able to dress.    OBJECTIVE:  Note: Objective measures were completed at Evaluation unless otherwise noted.   PATIENT SURVEYS :  Quick Dash:  QUICK DASH  Please rate your ability do the following activities in the last week by  selecting the number below the appropriate response.   Activities Rating  Open a tight or new jar.    Do heavy household chores (e.g., wash walls, floors).   Carry a shopping bag or briefcase   Wash your back.   Use a knife to cut food.   Recreational activities in which you take some force or impact through your arm, shoulder or hand (e.g., golf, hammering, tennis, etc.).   During the past week, to what extent has your arm, shoulder or hand problem interfered with your normal social activities with family, friends, neighbors or groups?    During the past week, were you limited in your work or other regular daily activities as a result of your arm, shoulder or hand problem?   Rate the severity of the following symptoms in the last week: Arm, Shoulder, or hand pain.   Rate the severity of the following symptoms in the last week: Tingling (pins and needles) in your arm, shoulder or hand.   During the past week, how much difficulty have you had sleeping because of the pain in your arm, shoulder or hand?     (A QuickDASH score may not be calculated if there is greater than 1 missing item.)  Quick Dash Disability/Symptom Score: [(sum of  (n) responses/ (n)] x 25 = 33  (02/22/24: 4.5%)   Minimally Clinically Important Difference (MCID): 15-20 points  (Franchignoni, F. et al. (2013). Minimally clinically important  difference of the disabilities of the arm, shoulder, and hand outcome measures (DASH) and its shortened version (Quick DASH). Journal of Orthopaedic & Sports Physical Therapy, 44(1), 30-39)   COGNITION: Overall cognitive status: Within functional limits for tasks assessed     SENSATION: WFL  POSTURE: rounded shoulders and forward head  UPPER EXTREMITY ROM:   Active ROM Right eval Left eval Left 02/22/24  Shoulder flexion wfl 105! 145  Shoulder extension     Shoulder abduction wfl 90! 160  Shoulder adduction     Shoulder internal rotation wfl wfl wfl  Shoulder external rotation wfl unable T2  Elbow flexion     Elbow extension     Wrist flexion     Wrist extension     Wrist ulnar deviation     Wrist radial deviation     Wrist pronation     Wrist supination     (Blank rows = not tested)  UPPER EXTREMITY MMT:  MMT Right eval Left eval Left 02/22/24  Shoulder flexion 4- 3+! 4  Shoulder extension     Shoulder abduction 4- 3+! 4  Shoulder adduction     Shoulder internal rotation 4 4 4+  Shoulder external rotation 4 4-! 4  Middle trapezius     Lower trapezius     Elbow flexion     Elbow extension     Wrist flexion     Wrist extension     Wrist ulnar deviation     Wrist radial deviation     Wrist pronation     Wrist supination     Grip strength (lbs)     (Blank rows = not tested)  SHOULDER SPECIAL TESTS:  Rotator cuff assessment: Empty can test: positive  and Infraspinatus test: positive    JOINT MOBILITY TESTING:  N/a  PALPATION:  TTP L infraspinatus/teres minor/latissimus  TREATMENT DATE:  [facility] Adult PT Treatment:                                                DATE: 02/22/24 Therapeutic Exercise: Er RTB  3 x 10  Row Blue TB 3 x 10  Supine horiz BlueTB  10 x 3  S/L shoulder ER 3 x 7 5# (pt has been using 5# at home)  Updated  HEP    TREATMENT 02/08/2024: Therapeutic Activity: UBE lvl 1.0 x 2 min FM row 3x8 13# L shoulder ER/IR 2x10 YTB Supine horizontal abd 2x10 GTB S/L shoulder ER 2x12 L Seated bilateral ER 2x10 YTB Seated low row 3x8 25#   TREATMENT 01/25/2024: Neuromuscular re-ed: YTB row x6x3s YTB ER x6x3s Self-care/Home Management: Patient educated on HEP, POC, prognosis, and relevant tissues/anatomy  PATIENT EDUCATION: Education details: HEP Person educated: Patient Education method: Programmer, Multimedia, Facilities Manager, Actor cues, Verbal cues, and Handouts Education comprehension: verbalized understanding and returned demonstration  HOME EXERCISE PROGRAM: 5x/wk, 2x/day, 2x6x3s Blue TB row x6x3s RTB ER x6x3s  Access Code: [email or web address] URL: [email or web address] Date: 02/08/2024 Prepared by: Alm Kingdom  Exercises - Supine Shoulder Horizontal Abduction with Resistance  - 1 x daily - 7 x weekly - 2-3 sets - 10 reps - Blue band hold - Sidelying Shoulder External Rotation  - 1 x daily - 7 x weekly - 2-3 sets - 7 reps  5# weight  ASSESSMENT:  CLINICAL IMPRESSION: Pt was able to complete all prescribed exercises with no adverse effect. Therapy today focused on improving dynamic stability and infraspinatus strength of L shoulder in order to improve functional ability and decrease pain. HEP was updated for continued progression. She continues to benefit from skilled PT services, will progress as tolerated. Pt has met most goals and feels much improved. Sees MD for f/u today and hopes to be released to RTW.   EVAL: Patient is a 29 year old female who presents with acute L shoulder pain after handing a patient on 01/07/24 w/ suspected acute RTC strain. Patient presents with deficits in: L shoulder ROM, strength, and excessive pain. As a result, the patient would benefit from skilled PT to address aforementioned deficits via plan below.    OBJECTIVE IMPAIRMENTS: decreased ROM, decreased  strength, impaired UE functional use, and pain.   ACTIVITY LIMITATIONS: carrying, lifting, and reach over head  PERSONAL FACTORS: Time since onset of injury/illness/exacerbation are also affecting patient's functional outcome.   REHAB POTENTIAL: Fair    CLINICAL DECISION MAKING: Stable/uncomplicated  EVALUATION COMPLEXITY: Moderate  GOALS: Goals reviewed with patient? No  SHORT TERM GOALS: Target date: 02/15/2024    1) Patient will demonstrate 75% HEP compliance to show independence with self-management of condition   Baseline: 0% Goal status: MET  2) Patient will decrease worst pain to 7 at most to improve ADL completion and overall QOL   Baseline: 9 02/22/24: 3/10 with sleeping on side  Goal status: MET     LONG TERM GOALS: Target date: 03/27/24   1) Patient will demonstrate 100% HEP compliance to show independence with self-management of condition  Baseline: 0% 02/22/24: updated  Goal status: ONGOING  2) Patient will decrease worst pain to 5 at most to improve ADL completion and overall QOL  Baseline: 9 02/22/24: 3/10 at worst with sleeping on side Goal status: MET   3) Patient will demonstrate  a 5 point improvement in quickdash to show improvements in ADL completion and overall QOL  Baseline: 33 02/22/24: 4.5%  Goal status: MET   4) Patient will have at least [facility] ROM of L shoulder without significant increases in pain to demonstrate improved joint mobility needed for ADL completion  Baseline: see chart Goal status: MET      PLAN: PT FREQUENCY: 1-2x/week  PT DURATION: 8 weeks  PLANNED INTERVENTIONS: 97110-Therapeutic exercises, 97530- Therapeutic activity, 97112- Neuromuscular re-education, 97535- Self Care, 02859- Manual therapy, and Patient/Family education  PLAN FOR NEXT SESSION: HEP assessment and progression, symptom modulation, and loading (isolated and/or functional). Manual therapy and NME training as needed. L RTC sx modulation via tolerated  exercises.     Harlene Persons, PTA 02/22/24 8:43 AM Phone: [phone] Fax: [phone]    "

## 2024-02-29 ENCOUNTER — Ambulatory Visit

## 2024-03-02 ENCOUNTER — Ambulatory Visit: Payer: Self-pay | Admitting: Student

## 2024-03-03 ENCOUNTER — Ambulatory Visit
